# Patient Record
Sex: Female | Born: 1965 | Race: White | Hispanic: No | Marital: Married | Smoking: Former smoker
Health system: Southern US, Community
[De-identification: ages and names within clinical notes are randomized; demographics above are authoritative.]

## PROBLEM LIST (undated history)

## (undated) DIAGNOSIS — R9082 White matter disease, unspecified: Secondary | ICD-10-CM

## (undated) DIAGNOSIS — R011 Cardiac murmur, unspecified: Secondary | ICD-10-CM

## (undated) DIAGNOSIS — R413 Other amnesia: Secondary | ICD-10-CM

## (undated) DIAGNOSIS — I341 Nonrheumatic mitral (valve) prolapse: Secondary | ICD-10-CM

## (undated) DIAGNOSIS — Z8619 Personal history of other infectious and parasitic diseases: Secondary | ICD-10-CM

## (undated) DIAGNOSIS — K5090 Crohn's disease, unspecified, without complications: Secondary | ICD-10-CM

## (undated) DIAGNOSIS — I10 Essential (primary) hypertension: Secondary | ICD-10-CM

## (undated) DIAGNOSIS — K529 Noninfective gastroenteritis and colitis, unspecified: Secondary | ICD-10-CM

## (undated) DIAGNOSIS — K648 Other hemorrhoids: Secondary | ICD-10-CM

## (undated) HISTORY — PX: PARTIAL HYSTERECTOMY: SHX80

## (undated) HISTORY — PX: HEMORRHOID SURGERY: SHX153

## (undated) HISTORY — DX: White matter disease, unspecified: R90.82

## (undated) HISTORY — DX: Crohn's disease, unspecified, without complications: K50.90

## (undated) HISTORY — DX: Nonrheumatic mitral (valve) prolapse: I34.1

## (undated) HISTORY — DX: Other amnesia: R41.3

## (undated) HISTORY — PX: BREAST ENHANCEMENT SURGERY: SHX7

## (undated) HISTORY — DX: Personal history of other infectious and parasitic diseases: Z86.19

## (undated) HISTORY — DX: Cardiac murmur, unspecified: R01.1

## (undated) HISTORY — PX: INCONTINENCE SURGERY: SHX676

## (undated) HISTORY — DX: Essential (primary) hypertension: I10

## (undated) HISTORY — PX: HAND SURGERY: SHX662

## (undated) HISTORY — PX: ESOPHAGOGASTRODUODENOSCOPY: SHX1529

## (undated) HISTORY — PX: OTHER SURGICAL HISTORY: SHX169

## (undated) HISTORY — PX: BREAST REDUCTION SURGERY: SHX8

## (undated) HISTORY — PX: LEEP: SHX91

## (undated) HISTORY — DX: Other hemorrhoids: K64.8

---

## 2015-05-05 DIAGNOSIS — L719 Rosacea, unspecified: Secondary | ICD-10-CM | POA: Diagnosis not present

## 2015-05-05 DIAGNOSIS — I10 Essential (primary) hypertension: Secondary | ICD-10-CM | POA: Diagnosis not present

## 2015-05-05 DIAGNOSIS — K649 Unspecified hemorrhoids: Secondary | ICD-10-CM | POA: Diagnosis not present

## 2015-05-05 DIAGNOSIS — N951 Menopausal and female climacteric states: Secondary | ICD-10-CM | POA: Diagnosis not present

## 2015-05-12 DIAGNOSIS — K6289 Other specified diseases of anus and rectum: Secondary | ICD-10-CM | POA: Diagnosis not present

## 2015-05-12 DIAGNOSIS — K644 Residual hemorrhoidal skin tags: Secondary | ICD-10-CM | POA: Diagnosis not present

## 2015-05-12 DIAGNOSIS — K642 Third degree hemorrhoids: Secondary | ICD-10-CM | POA: Diagnosis not present

## 2015-05-12 DIAGNOSIS — K625 Hemorrhage of anus and rectum: Secondary | ICD-10-CM | POA: Diagnosis not present

## 2015-08-25 DIAGNOSIS — K5090 Crohn's disease, unspecified, without complications: Secondary | ICD-10-CM | POA: Diagnosis not present

## 2015-08-25 DIAGNOSIS — I10 Essential (primary) hypertension: Secondary | ICD-10-CM | POA: Diagnosis not present

## 2015-08-25 DIAGNOSIS — Z79899 Other long term (current) drug therapy: Secondary | ICD-10-CM | POA: Diagnosis not present

## 2015-09-07 DIAGNOSIS — T63481A Toxic effect of venom of other arthropod, accidental (unintentional), initial encounter: Secondary | ICD-10-CM | POA: Diagnosis not present

## 2015-09-07 DIAGNOSIS — M25473 Effusion, unspecified ankle: Secondary | ICD-10-CM | POA: Diagnosis not present

## 2015-09-14 DIAGNOSIS — Z1211 Encounter for screening for malignant neoplasm of colon: Secondary | ICD-10-CM | POA: Diagnosis not present

## 2015-09-14 DIAGNOSIS — K5090 Crohn's disease, unspecified, without complications: Secondary | ICD-10-CM | POA: Diagnosis not present

## 2015-09-14 DIAGNOSIS — Z01818 Encounter for other preprocedural examination: Secondary | ICD-10-CM | POA: Diagnosis not present

## 2015-09-29 DIAGNOSIS — K5289 Other specified noninfective gastroenteritis and colitis: Secondary | ICD-10-CM | POA: Diagnosis not present

## 2015-09-29 DIAGNOSIS — K649 Unspecified hemorrhoids: Secondary | ICD-10-CM | POA: Diagnosis not present

## 2015-09-29 DIAGNOSIS — K5090 Crohn's disease, unspecified, without complications: Secondary | ICD-10-CM | POA: Diagnosis not present

## 2015-09-29 DIAGNOSIS — K6389 Other specified diseases of intestine: Secondary | ICD-10-CM | POA: Diagnosis not present

## 2015-09-29 DIAGNOSIS — K648 Other hemorrhoids: Secondary | ICD-10-CM | POA: Diagnosis not present

## 2015-09-29 DIAGNOSIS — K529 Noninfective gastroenteritis and colitis, unspecified: Secondary | ICD-10-CM | POA: Diagnosis not present

## 2015-09-29 DIAGNOSIS — Z1211 Encounter for screening for malignant neoplasm of colon: Secondary | ICD-10-CM | POA: Diagnosis not present

## 2015-09-29 HISTORY — PX: COLONOSCOPY: SHX174

## 2015-10-13 DIAGNOSIS — K642 Third degree hemorrhoids: Secondary | ICD-10-CM | POA: Diagnosis not present

## 2015-10-13 DIAGNOSIS — K6289 Other specified diseases of anus and rectum: Secondary | ICD-10-CM | POA: Diagnosis not present

## 2015-10-13 DIAGNOSIS — K644 Residual hemorrhoidal skin tags: Secondary | ICD-10-CM | POA: Diagnosis not present

## 2015-10-13 DIAGNOSIS — K625 Hemorrhage of anus and rectum: Secondary | ICD-10-CM | POA: Diagnosis not present

## 2015-10-20 DIAGNOSIS — K642 Third degree hemorrhoids: Secondary | ICD-10-CM | POA: Diagnosis not present

## 2015-10-20 DIAGNOSIS — K644 Residual hemorrhoidal skin tags: Secondary | ICD-10-CM | POA: Diagnosis not present

## 2015-10-20 DIAGNOSIS — K6289 Other specified diseases of anus and rectum: Secondary | ICD-10-CM | POA: Diagnosis not present

## 2015-10-20 DIAGNOSIS — K648 Other hemorrhoids: Secondary | ICD-10-CM | POA: Diagnosis not present

## 2015-10-27 DIAGNOSIS — M720 Palmar fascial fibromatosis [Dupuytren]: Secondary | ICD-10-CM | POA: Diagnosis not present

## 2015-11-17 DIAGNOSIS — M720 Palmar fascial fibromatosis [Dupuytren]: Secondary | ICD-10-CM | POA: Diagnosis not present

## 2015-11-19 DIAGNOSIS — M720 Palmar fascial fibromatosis [Dupuytren]: Secondary | ICD-10-CM | POA: Diagnosis not present

## 2015-12-08 DIAGNOSIS — M720 Palmar fascial fibromatosis [Dupuytren]: Secondary | ICD-10-CM | POA: Diagnosis not present

## 2016-01-18 DIAGNOSIS — Z1231 Encounter for screening mammogram for malignant neoplasm of breast: Secondary | ICD-10-CM | POA: Diagnosis not present

## 2016-02-02 DIAGNOSIS — Z79899 Other long term (current) drug therapy: Secondary | ICD-10-CM | POA: Diagnosis not present

## 2016-02-02 DIAGNOSIS — I10 Essential (primary) hypertension: Secondary | ICD-10-CM | POA: Diagnosis not present

## 2016-03-28 DIAGNOSIS — K5090 Crohn's disease, unspecified, without complications: Secondary | ICD-10-CM | POA: Diagnosis not present

## 2016-04-12 DIAGNOSIS — J301 Allergic rhinitis due to pollen: Secondary | ICD-10-CM | POA: Diagnosis not present

## 2016-04-26 DIAGNOSIS — Z0001 Encounter for general adult medical examination with abnormal findings: Secondary | ICD-10-CM | POA: Diagnosis not present

## 2016-04-26 DIAGNOSIS — Z0000 Encounter for general adult medical examination without abnormal findings: Secondary | ICD-10-CM | POA: Diagnosis not present

## 2016-04-26 DIAGNOSIS — Z79899 Other long term (current) drug therapy: Secondary | ICD-10-CM | POA: Diagnosis not present

## 2016-04-26 DIAGNOSIS — K5090 Crohn's disease, unspecified, without complications: Secondary | ICD-10-CM | POA: Diagnosis not present

## 2016-04-26 DIAGNOSIS — Z124 Encounter for screening for malignant neoplasm of cervix: Secondary | ICD-10-CM | POA: Diagnosis not present

## 2016-04-26 DIAGNOSIS — I10 Essential (primary) hypertension: Secondary | ICD-10-CM | POA: Diagnosis not present

## 2016-07-30 DIAGNOSIS — B029 Zoster without complications: Secondary | ICD-10-CM | POA: Insufficient documentation

## 2016-08-03 DIAGNOSIS — Z6823 Body mass index (BMI) 23.0-23.9, adult: Secondary | ICD-10-CM | POA: Diagnosis not present

## 2016-08-03 DIAGNOSIS — B0230 Zoster ocular disease, unspecified: Secondary | ICD-10-CM | POA: Diagnosis not present

## 2016-08-08 DIAGNOSIS — B029 Zoster without complications: Secondary | ICD-10-CM | POA: Diagnosis not present

## 2016-08-23 DIAGNOSIS — M720 Palmar fascial fibromatosis [Dupuytren]: Secondary | ICD-10-CM | POA: Insufficient documentation

## 2016-08-23 DIAGNOSIS — N393 Stress incontinence (female) (male): Secondary | ICD-10-CM | POA: Diagnosis not present

## 2016-08-23 DIAGNOSIS — I10 Essential (primary) hypertension: Secondary | ICD-10-CM | POA: Insufficient documentation

## 2016-08-23 DIAGNOSIS — K5090 Crohn's disease, unspecified, without complications: Secondary | ICD-10-CM | POA: Insufficient documentation

## 2016-08-23 DIAGNOSIS — N3641 Hypermobility of urethra: Secondary | ICD-10-CM | POA: Diagnosis not present

## 2016-08-23 DIAGNOSIS — K529 Noninfective gastroenteritis and colitis, unspecified: Secondary | ICD-10-CM | POA: Insufficient documentation

## 2016-08-23 DIAGNOSIS — D497 Neoplasm of unspecified behavior of endocrine glands and other parts of nervous system: Secondary | ICD-10-CM | POA: Insufficient documentation

## 2016-09-01 DIAGNOSIS — Z01818 Encounter for other preprocedural examination: Secondary | ICD-10-CM | POA: Diagnosis not present

## 2016-09-01 DIAGNOSIS — N3641 Hypermobility of urethra: Secondary | ICD-10-CM | POA: Diagnosis not present

## 2016-09-01 DIAGNOSIS — I10 Essential (primary) hypertension: Secondary | ICD-10-CM | POA: Diagnosis not present

## 2016-09-01 DIAGNOSIS — N393 Stress incontinence (female) (male): Secondary | ICD-10-CM | POA: Diagnosis not present

## 2016-09-05 DIAGNOSIS — I10 Essential (primary) hypertension: Secondary | ICD-10-CM | POA: Diagnosis not present

## 2016-09-06 DIAGNOSIS — N3641 Hypermobility of urethra: Secondary | ICD-10-CM | POA: Diagnosis not present

## 2016-09-06 DIAGNOSIS — N393 Stress incontinence (female) (male): Secondary | ICD-10-CM | POA: Diagnosis not present

## 2016-09-06 DIAGNOSIS — I10 Essential (primary) hypertension: Secondary | ICD-10-CM | POA: Diagnosis not present

## 2016-09-06 DIAGNOSIS — Z79899 Other long term (current) drug therapy: Secondary | ICD-10-CM | POA: Diagnosis not present

## 2016-09-20 DIAGNOSIS — I10 Essential (primary) hypertension: Secondary | ICD-10-CM | POA: Diagnosis not present

## 2016-09-20 DIAGNOSIS — K1379 Other lesions of oral mucosa: Secondary | ICD-10-CM | POA: Diagnosis not present

## 2016-09-20 DIAGNOSIS — Z79899 Other long term (current) drug therapy: Secondary | ICD-10-CM | POA: Diagnosis not present

## 2016-09-27 DIAGNOSIS — K5090 Crohn's disease, unspecified, without complications: Secondary | ICD-10-CM | POA: Diagnosis not present

## 2016-12-20 DIAGNOSIS — B001 Herpesviral vesicular dermatitis: Secondary | ICD-10-CM | POA: Diagnosis not present

## 2016-12-20 DIAGNOSIS — I10 Essential (primary) hypertension: Secondary | ICD-10-CM | POA: Diagnosis not present

## 2017-01-24 DIAGNOSIS — J0101 Acute recurrent maxillary sinusitis: Secondary | ICD-10-CM | POA: Diagnosis not present

## 2017-01-31 DIAGNOSIS — J019 Acute sinusitis, unspecified: Secondary | ICD-10-CM | POA: Diagnosis not present

## 2017-01-31 DIAGNOSIS — J309 Allergic rhinitis, unspecified: Secondary | ICD-10-CM | POA: Diagnosis not present

## 2017-02-10 DIAGNOSIS — J029 Acute pharyngitis, unspecified: Secondary | ICD-10-CM | POA: Diagnosis not present

## 2017-02-10 DIAGNOSIS — J209 Acute bronchitis, unspecified: Secondary | ICD-10-CM | POA: Diagnosis not present

## 2017-02-21 DIAGNOSIS — H04123 Dry eye syndrome of bilateral lacrimal glands: Secondary | ICD-10-CM | POA: Diagnosis not present

## 2017-02-21 DIAGNOSIS — H5203 Hypermetropia, bilateral: Secondary | ICD-10-CM | POA: Diagnosis not present

## 2017-02-21 DIAGNOSIS — H2513 Age-related nuclear cataract, bilateral: Secondary | ICD-10-CM | POA: Diagnosis not present

## 2017-02-21 DIAGNOSIS — H524 Presbyopia: Secondary | ICD-10-CM | POA: Diagnosis not present

## 2017-03-20 DIAGNOSIS — K5090 Crohn's disease, unspecified, without complications: Secondary | ICD-10-CM | POA: Diagnosis not present

## 2017-03-20 DIAGNOSIS — E538 Deficiency of other specified B group vitamins: Secondary | ICD-10-CM | POA: Diagnosis not present

## 2017-04-04 DIAGNOSIS — R194 Change in bowel habit: Secondary | ICD-10-CM | POA: Diagnosis not present

## 2017-04-04 DIAGNOSIS — E538 Deficiency of other specified B group vitamins: Secondary | ICD-10-CM | POA: Diagnosis not present

## 2017-04-04 DIAGNOSIS — I10 Essential (primary) hypertension: Secondary | ICD-10-CM | POA: Diagnosis not present

## 2017-04-04 DIAGNOSIS — Z79899 Other long term (current) drug therapy: Secondary | ICD-10-CM | POA: Diagnosis not present

## 2017-04-04 DIAGNOSIS — Z6825 Body mass index (BMI) 25.0-25.9, adult: Secondary | ICD-10-CM | POA: Diagnosis not present

## 2017-04-06 DIAGNOSIS — J329 Chronic sinusitis, unspecified: Secondary | ICD-10-CM | POA: Diagnosis not present

## 2017-05-10 DIAGNOSIS — Z01812 Encounter for preprocedural laboratory examination: Secondary | ICD-10-CM | POA: Diagnosis not present

## 2017-05-10 DIAGNOSIS — I10 Essential (primary) hypertension: Secondary | ICD-10-CM | POA: Diagnosis not present

## 2017-05-10 DIAGNOSIS — Z01818 Encounter for other preprocedural examination: Secondary | ICD-10-CM | POA: Diagnosis not present

## 2017-05-23 ENCOUNTER — Telehealth: Payer: Self-pay | Admitting: Gastroenterology

## 2017-05-23 NOTE — Telephone Encounter (Signed)
Patient called Humira and it was a mistake to bill her $500.00.  She says everything is straightened out and no further action required at this time.

## 2017-05-30 DIAGNOSIS — N649 Disorder of breast, unspecified: Secondary | ICD-10-CM | POA: Diagnosis not present

## 2017-07-03 DIAGNOSIS — I10 Essential (primary) hypertension: Secondary | ICD-10-CM | POA: Diagnosis not present

## 2017-07-03 DIAGNOSIS — N61 Mastitis without abscess: Secondary | ICD-10-CM | POA: Diagnosis not present

## 2017-07-04 DIAGNOSIS — N61 Mastitis without abscess: Secondary | ICD-10-CM | POA: Diagnosis not present

## 2017-07-18 DIAGNOSIS — Z6826 Body mass index (BMI) 26.0-26.9, adult: Secondary | ICD-10-CM | POA: Diagnosis not present

## 2017-07-18 DIAGNOSIS — N61 Mastitis without abscess: Secondary | ICD-10-CM | POA: Diagnosis not present

## 2017-08-29 DIAGNOSIS — S21001A Unspecified open wound of right breast, initial encounter: Secondary | ICD-10-CM | POA: Diagnosis not present

## 2017-10-08 ENCOUNTER — Other Ambulatory Visit: Payer: Self-pay | Admitting: Gastroenterology

## 2017-10-10 DIAGNOSIS — Z87891 Personal history of nicotine dependence: Secondary | ICD-10-CM | POA: Diagnosis not present

## 2017-10-10 DIAGNOSIS — Z8542 Personal history of malignant neoplasm of other parts of uterus: Secondary | ICD-10-CM | POA: Diagnosis not present

## 2017-10-10 DIAGNOSIS — L98492 Non-pressure chronic ulcer of skin of other sites with fat layer exposed: Secondary | ICD-10-CM | POA: Diagnosis not present

## 2017-10-10 DIAGNOSIS — D649 Anemia, unspecified: Secondary | ICD-10-CM | POA: Diagnosis not present

## 2017-10-10 DIAGNOSIS — S21001A Unspecified open wound of right breast, initial encounter: Secondary | ICD-10-CM | POA: Diagnosis not present

## 2017-10-10 DIAGNOSIS — T8189XA Other complications of procedures, not elsewhere classified, initial encounter: Secondary | ICD-10-CM | POA: Diagnosis not present

## 2017-10-10 DIAGNOSIS — X58XXXA Exposure to other specified factors, initial encounter: Secondary | ICD-10-CM | POA: Diagnosis not present

## 2017-10-10 MED ORDER — ADALIMUMAB 40 MG/0.8ML ~~LOC~~ PSKT: 40.0000 mg | PREFILLED_SYRINGE | SUBCUTANEOUS | 3 refills | Status: DC

## 2017-10-10 NOTE — Telephone Encounter (Signed)
Refill for Humira sent to pharmacy.

## 2017-10-10 NOTE — Telephone Encounter (Signed)
Brooke Holmes from [facility] is calling in regarding this rx. Best # [phone].

## 2017-10-17 DIAGNOSIS — X58XXXA Exposure to other specified factors, initial encounter: Secondary | ICD-10-CM | POA: Diagnosis not present

## 2017-10-17 DIAGNOSIS — S21001A Unspecified open wound of right breast, initial encounter: Secondary | ICD-10-CM | POA: Diagnosis not present

## 2017-10-17 DIAGNOSIS — L98492 Non-pressure chronic ulcer of skin of other sites with fat layer exposed: Secondary | ICD-10-CM | POA: Diagnosis not present

## 2017-10-17 DIAGNOSIS — T8189XA Other complications of procedures, not elsewhere classified, initial encounter: Secondary | ICD-10-CM | POA: Diagnosis not present

## 2017-10-24 DIAGNOSIS — X58XXXA Exposure to other specified factors, initial encounter: Secondary | ICD-10-CM | POA: Diagnosis not present

## 2017-10-24 DIAGNOSIS — L98492 Non-pressure chronic ulcer of skin of other sites with fat layer exposed: Secondary | ICD-10-CM | POA: Diagnosis not present

## 2017-10-24 DIAGNOSIS — S21001A Unspecified open wound of right breast, initial encounter: Secondary | ICD-10-CM | POA: Diagnosis not present

## 2017-10-24 DIAGNOSIS — T8189XA Other complications of procedures, not elsewhere classified, initial encounter: Secondary | ICD-10-CM | POA: Diagnosis not present

## 2017-10-29 DIAGNOSIS — L98492 Non-pressure chronic ulcer of skin of other sites with fat layer exposed: Secondary | ICD-10-CM | POA: Diagnosis not present

## 2017-10-29 DIAGNOSIS — S21001A Unspecified open wound of right breast, initial encounter: Secondary | ICD-10-CM | POA: Diagnosis not present

## 2017-10-29 DIAGNOSIS — X58XXXA Exposure to other specified factors, initial encounter: Secondary | ICD-10-CM | POA: Diagnosis not present

## 2017-10-29 DIAGNOSIS — T8189XA Other complications of procedures, not elsewhere classified, initial encounter: Secondary | ICD-10-CM | POA: Diagnosis not present

## 2017-10-31 DIAGNOSIS — N3641 Hypermobility of urethra: Secondary | ICD-10-CM | POA: Diagnosis not present

## 2017-10-31 DIAGNOSIS — N393 Stress incontinence (female) (male): Secondary | ICD-10-CM | POA: Diagnosis not present

## 2017-11-06 DIAGNOSIS — Z09 Encounter for follow-up examination after completed treatment for conditions other than malignant neoplasm: Secondary | ICD-10-CM | POA: Diagnosis not present

## 2017-11-06 DIAGNOSIS — L98499 Non-pressure chronic ulcer of skin of other sites with unspecified severity: Secondary | ICD-10-CM | POA: Diagnosis not present

## 2017-11-06 DIAGNOSIS — T8189XA Other complications of procedures, not elsewhere classified, initial encounter: Secondary | ICD-10-CM | POA: Diagnosis not present

## 2017-11-06 DIAGNOSIS — Z87828 Personal history of other (healed) physical injury and trauma: Secondary | ICD-10-CM | POA: Diagnosis not present

## 2017-11-13 DIAGNOSIS — E663 Overweight: Secondary | ICD-10-CM | POA: Diagnosis not present

## 2017-11-13 DIAGNOSIS — Z6826 Body mass index (BMI) 26.0-26.9, adult: Secondary | ICD-10-CM | POA: Diagnosis not present

## 2017-11-13 DIAGNOSIS — Z79899 Other long term (current) drug therapy: Secondary | ICD-10-CM | POA: Diagnosis not present

## 2017-11-13 DIAGNOSIS — I10 Essential (primary) hypertension: Secondary | ICD-10-CM | POA: Diagnosis not present

## 2017-11-13 DIAGNOSIS — K5090 Crohn's disease, unspecified, without complications: Secondary | ICD-10-CM | POA: Diagnosis not present

## 2017-11-28 DIAGNOSIS — M79641 Pain in right hand: Secondary | ICD-10-CM | POA: Diagnosis not present

## 2017-11-28 DIAGNOSIS — M720 Palmar fascial fibromatosis [Dupuytren]: Secondary | ICD-10-CM | POA: Diagnosis not present

## 2017-11-28 DIAGNOSIS — M79642 Pain in left hand: Secondary | ICD-10-CM | POA: Diagnosis not present

## 2018-01-07 DIAGNOSIS — M720 Palmar fascial fibromatosis [Dupuytren]: Secondary | ICD-10-CM | POA: Diagnosis not present

## 2018-01-09 DIAGNOSIS — M79642 Pain in left hand: Secondary | ICD-10-CM | POA: Diagnosis not present

## 2018-01-09 DIAGNOSIS — M720 Palmar fascial fibromatosis [Dupuytren]: Secondary | ICD-10-CM | POA: Diagnosis not present

## 2018-01-18 ENCOUNTER — Other Ambulatory Visit: Payer: Self-pay

## 2018-01-18 DIAGNOSIS — K50919 Crohn's disease, unspecified, with unspecified complications: Secondary | ICD-10-CM

## 2018-01-18 MED ORDER — ADALIMUMAB 40 MG/0.8ML ~~LOC~~ PSKT: 40.0000 mg | PREFILLED_SYRINGE | SUBCUTANEOUS | 6 refills | Status: DC

## 2018-01-24 DIAGNOSIS — M79642 Pain in left hand: Secondary | ICD-10-CM | POA: Diagnosis not present

## 2018-01-24 DIAGNOSIS — M720 Palmar fascial fibromatosis [Dupuytren]: Secondary | ICD-10-CM | POA: Diagnosis not present

## 2018-01-24 DIAGNOSIS — Z5189 Encounter for other specified aftercare: Secondary | ICD-10-CM | POA: Diagnosis not present

## 2018-01-28 DIAGNOSIS — N61 Mastitis without abscess: Secondary | ICD-10-CM | POA: Diagnosis not present

## 2018-01-28 DIAGNOSIS — Z6827 Body mass index (BMI) 27.0-27.9, adult: Secondary | ICD-10-CM | POA: Diagnosis not present

## 2018-02-04 ENCOUNTER — Telehealth: Payer: Self-pay | Admitting: Gastroenterology

## 2018-02-04 ENCOUNTER — Encounter: Payer: Self-pay | Admitting: Gastroenterology

## 2018-02-04 DIAGNOSIS — K50919 Crohn's disease, unspecified, with unspecified complications: Secondary | ICD-10-CM

## 2018-02-04 NOTE — Telephone Encounter (Signed)
Pt needs rf for humira.

## 2018-02-05 MED ORDER — ADALIMUMAB 40 MG/0.8ML ~~LOC~~ PSKT: 40.0000 mg | PREFILLED_SYRINGE | SUBCUTANEOUS | 6 refills | Status: DC

## 2018-02-05 NOTE — Telephone Encounter (Signed)
Sent refill to patients pharmacy.

## 2018-03-14 ENCOUNTER — Encounter: Payer: Self-pay | Admitting: Gastroenterology

## 2018-03-18 ENCOUNTER — Ambulatory Visit: Payer: Self-pay | Admitting: Gastroenterology

## 2018-03-18 ENCOUNTER — Ambulatory Visit: Payer: BLUE CROSS/BLUE SHIELD | Admitting: Gastroenterology

## 2018-03-18 VITALS — BP 138/62 | HR 74 | Ht 65.0 in | Wt 162.0 lb

## 2018-03-18 DIAGNOSIS — K50919 Crohn's disease, unspecified, with unspecified complications: Secondary | ICD-10-CM

## 2018-03-18 MED ORDER — ADALIMUMAB 40 MG/0.8ML ~~LOC~~ AJKT: 1.0000 "pen " | AUTO-INJECTOR | SUBCUTANEOUS | 4 refills | Status: DC

## 2018-03-18 MED ORDER — FLUTICASONE PROPIONATE 0.05 % EX CREA: TOPICAL_CREAM | Freq: Two times a day (BID) | CUTANEOUS | 1 refills | Status: AC

## 2018-03-18 NOTE — Progress Notes (Signed)
Chief Complaint:   Referring Provider:  Ernestene Kiel, MD      ASSESSMENT AND PLAN;   #1. Crohn's colitis. Dx 2004, involving transverse/desc colon with Nl TI. Rx with steroids, Asacol and then Lialda. Nl TPMT, neg TB test.  Now on maintenance Humira since 03/2014.  Status post Pneumovax 01/2014 #2. B12 deficiency. #3. Internal hemorrhoids.  No perianal Crohn's.  Plan: -Humira pen 29m SQ Q2weekly -B12 128mIM Q monthly. -For Hoids- fluticasone cream 0.05% generic 30g 1 bid PR PRN x 10 days, 1 refill -Has appt Dr PrLaqueta Dueor blood tests later this week. -FU in 1 year.  Earlier in case of any problems.  Recommend repeating colonoscopy next year.   HPI:    Brooke Holmes a [age].o. female  Doing very well. No nausea, vomiting, heartburn, regurgitation, odynophagia or dysphagia.  No significant diarrhea or constipation.  There is no melena or hematochezia. No unintentional weight loss. Here for routine follow-up visit. Wanted to have prescription for external hemorrhoids in case she needs any in the future.  Has not been bothering her. Would like to get colonoscopy done next year rather than this year.    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 [place] 26[place] Past Medical History:  Diagnosis Date  . Crohn's disease ([place]  . Hx of Clostridium difficile infection   . Internal hemorrhoid   . Mitral valve prolapse     No family history on file.  Social History   Tobacco Use  . Smoking status: Not on file  Substance Use Topics  . Alcohol use: Not on file  . Drug use: Not on file    Current Outpatient Medications  Medication Sig Dispense Refill  . acyclovir (ZOVIRAX) 800 MG tablet Take 800 mg by mouth 5 (five) times daily.    . Adalimumab (HUMIRA) 40 MG/0.8ML PSKT Inject 0.8 mLs (40 mg total) into the skin every 14  (fourteen) days. Pt needs to make f/u appt for 4-6 months from now 2 each 6  . Cyanocobalamin 1000 MCG/ML KIT Inject as directed.    . Marland KitchenPINEPHrine (EPIPEN 2-PAK IJ) Inject as directed.    . metroNIDAZOLE (METROCREAM) 0.75 % cream Apply topically 2 (two) times daily.    . phentermine 37.5 MG capsule Take 37.5 mg by mouth every morning.    . topiramate (TOPAMAX) 50 MG tablet Take 50 mg by mouth 2 (two) times daily.    . Marland Kitchenriamcinolone cream (KENALOG) 0.1 % Apply 1 application topically 2 (two) times daily.    . valsartan (DIOVAN) 160 MG tablet Take 160 mg by mouth daily.     No current facility-administered medications for this visit.     Allergies  Allergen Reactions  . Ciprofloxacin   . Metronidazole   . Morphine And Related     Review of Systems:  Constitutional: Denies fever, chills, diaphoresis, appetite change and fatigue.  HEENT: Denies photophobia, eye pain, redness, hearing loss, ear pain, congestion, sore throat, rhinorrhea, sneezing, mouth sores, neck pain, neck stiffness and tinnitus.   Respiratory: Denies SOB, DOE, cough, chest tightness,  and wheezing.   Cardiovascular: Denies chest pain, palpitations and leg swelling.  Genitourinary: Denies dysuria, urgency, frequency, hematuria, flank pain and difficulty urinating.  Musculoskeletal: Denies myalgias, back pain, joint swelling, arthralgias and gait problem.  Skin: No rash.  Neurological: Denies dizziness, seizures, syncope, weakness,  light-headedness, numbness and headaches.  Hematological: Denies adenopathy. Easy bruising, personal or family bleeding history  Psychiatric/Behavioral: No anxiety or depression     Physical Exam:    BP 138/62   Pulse 74   Ht 5' 5"  (1.651 m)   Wt 162 lb (73.5 kg)   BMI 26.96 kg/m  Filed Weights   03/18/18 1326  Weight: 162 lb (73.5 kg)   Constitutional:  Well-developed, in no acute distress. Psychiatric: Normal mood and affect. Behavior is normal. HEENT: Pupils normal.   Conjunctivae are normal. No scleral icterus. Cardiovascular: Normal rate, regular rhythm. No edema Pulmonary/chest: Effort normal and breath sounds normal. No wheezing, rales or rhonchi. Abdominal: Soft, nondistended. Nontender. Bowel sounds active throughout. There are no masses palpable. No hepatomegaly. Rectal:  defered Neurological: Alert and oriented to person place and time. Skin: Skin is warm and dry. No rashes noted.  Data Reviewed: I have personally reviewed following labs and imaging studies    Carmell Austria, MD 03/18/2018, 1:58 PM  Cc: Ernestene Kiel, MD

## 2018-03-18 NOTE — Patient Instructions (Addendum)
If you are age 52 or older, your body mass index should be between 23-30. Your Body mass index is 26.96 kg/m. If this is out of the aforementioned range listed, please consider follow up with your Primary Care Provider.  If you are age 43 or younger, your body mass index should be between 19-25. Your Body mass index is 26.96 kg/m. If this is out of the aformentioned range listed, please consider follow up with your Primary Care Provider.   We have sent the following medications to your pharmacy for you to pick up at your convenience: Fluticasone Humira  Thank you,  Dr. Jackquline Denmark

## 2018-03-19 DIAGNOSIS — Z6827 Body mass index (BMI) 27.0-27.9, adult: Secondary | ICD-10-CM | POA: Diagnosis not present

## 2018-03-19 DIAGNOSIS — I10 Essential (primary) hypertension: Secondary | ICD-10-CM | POA: Diagnosis not present

## 2018-03-19 DIAGNOSIS — N644 Mastodynia: Secondary | ICD-10-CM | POA: Diagnosis not present

## 2018-03-19 DIAGNOSIS — K5090 Crohn's disease, unspecified, without complications: Secondary | ICD-10-CM | POA: Diagnosis not present

## 2018-03-20 ENCOUNTER — Other Ambulatory Visit: Payer: Self-pay

## 2018-03-20 MED ORDER — ADALIMUMAB 40 MG/0.8ML ~~LOC~~ AJKT: 1.0000 "pen " | AUTO-INJECTOR | SUBCUTANEOUS | 4 refills | Status: DC

## 2018-03-20 NOTE — Telephone Encounter (Signed)
Resent prescription to [facility], left message for patient.

## 2018-03-20 NOTE — Telephone Encounter (Signed)
Pt called wants to know if we sent her Humira to the correct place. It was sent to [facility] but she states it normally comes from [facility] - [place], [place] [id number]. WJust want to know if its okay.

## 2018-04-08 DIAGNOSIS — M720 Palmar fascial fibromatosis [Dupuytren]: Secondary | ICD-10-CM | POA: Diagnosis not present

## 2018-04-08 DIAGNOSIS — M79642 Pain in left hand: Secondary | ICD-10-CM | POA: Diagnosis not present

## 2018-04-10 DIAGNOSIS — M720 Palmar fascial fibromatosis [Dupuytren]: Secondary | ICD-10-CM | POA: Diagnosis not present

## 2018-04-10 DIAGNOSIS — M79642 Pain in left hand: Secondary | ICD-10-CM | POA: Diagnosis not present

## 2018-04-10 DIAGNOSIS — Z5189 Encounter for other specified aftercare: Secondary | ICD-10-CM | POA: Diagnosis not present

## 2018-04-25 ENCOUNTER — Telehealth: Payer: Self-pay | Admitting: Gastroenterology

## 2018-04-25 NOTE — Telephone Encounter (Signed)
Left message for patient to call back with the type of cosmetic procedure she is having done.

## 2018-04-25 NOTE — Telephone Encounter (Signed)
Pt requested clearance for Humira faxed to Dr. [employer]: [phone]

## 2018-04-30 NOTE — Telephone Encounter (Signed)
Dr. Brennan Bailey office called inquiring about clearance that pt needs for cosmetic surgery. Pt is having a breast lift and upper eyelid surgery. Pls fax clearance to [phone].

## 2018-05-01 NOTE — Telephone Encounter (Signed)
Pt called back on the previous msg.

## 2018-05-01 NOTE — Telephone Encounter (Signed)
Please advise 

## 2018-05-01 NOTE — Telephone Encounter (Signed)
Dr. Brennan Bailey office called inquiring about clearance that pt needs for cosmetic surgery. Pt is having a breast lift and upper eyelid surgery. Pls fax clearance to [phone].

## 2018-05-03 ENCOUNTER — Other Ambulatory Visit: Payer: Self-pay

## 2018-05-03 NOTE — Telephone Encounter (Signed)
Faxed note to the doctor office. See letters from today's date in chart.

## 2018-05-03 NOTE — Telephone Encounter (Signed)
Cleared for surgery Can do surgery 2 weeks after Humira.  Restart 2 weeks after surgery. In other words she just has to miss 1 dose of Humira.

## 2018-07-10 DIAGNOSIS — M720 Palmar fascial fibromatosis [Dupuytren]: Secondary | ICD-10-CM | POA: Diagnosis not present

## 2018-07-23 DIAGNOSIS — E663 Overweight: Secondary | ICD-10-CM | POA: Diagnosis not present

## 2018-07-23 DIAGNOSIS — I10 Essential (primary) hypertension: Secondary | ICD-10-CM | POA: Diagnosis not present

## 2018-07-23 DIAGNOSIS — K5090 Crohn's disease, unspecified, without complications: Secondary | ICD-10-CM | POA: Diagnosis not present

## 2018-07-23 DIAGNOSIS — Z6828 Body mass index (BMI) 28.0-28.9, adult: Secondary | ICD-10-CM | POA: Diagnosis not present

## 2018-08-09 DIAGNOSIS — M79642 Pain in left hand: Secondary | ICD-10-CM | POA: Diagnosis not present

## 2018-08-09 DIAGNOSIS — M720 Palmar fascial fibromatosis [Dupuytren]: Secondary | ICD-10-CM | POA: Diagnosis not present

## 2018-11-05 DIAGNOSIS — I10 Essential (primary) hypertension: Secondary | ICD-10-CM | POA: Diagnosis not present

## 2018-11-05 DIAGNOSIS — B001 Herpesviral vesicular dermatitis: Secondary | ICD-10-CM | POA: Diagnosis not present

## 2018-11-05 DIAGNOSIS — K5090 Crohn's disease, unspecified, without complications: Secondary | ICD-10-CM | POA: Diagnosis not present

## 2018-11-05 DIAGNOSIS — F419 Anxiety disorder, unspecified: Secondary | ICD-10-CM | POA: Diagnosis not present

## 2018-11-05 DIAGNOSIS — Z79899 Other long term (current) drug therapy: Secondary | ICD-10-CM | POA: Diagnosis not present

## 2018-12-05 DIAGNOSIS — M545 Low back pain: Secondary | ICD-10-CM | POA: Diagnosis not present

## 2018-12-05 DIAGNOSIS — M546 Pain in thoracic spine: Secondary | ICD-10-CM | POA: Diagnosis not present

## 2018-12-05 DIAGNOSIS — M542 Cervicalgia: Secondary | ICD-10-CM | POA: Diagnosis not present

## 2018-12-05 DIAGNOSIS — R413 Other amnesia: Secondary | ICD-10-CM | POA: Diagnosis not present

## 2018-12-13 ENCOUNTER — Telehealth: Payer: Self-pay | Admitting: Gastroenterology

## 2018-12-13 DIAGNOSIS — K50919 Crohn's disease, unspecified, with unspecified complications: Secondary | ICD-10-CM

## 2018-12-13 NOTE — Telephone Encounter (Signed)
Is the Humira refill appropriate for this patient? Does she need to be scheduled for a f/u OV?

## 2018-12-13 NOTE — Telephone Encounter (Signed)
Humira refill is appropriate Same dose Same frequency 6 refills  Thx  RG

## 2018-12-13 NOTE — Telephone Encounter (Signed)
[facility] called to request a refill for Humira.

## 2018-12-16 MED ORDER — HUMIRA 40 MG/0.8ML ~~LOC~~ PSKT: 40.0000 mg | PREFILLED_SYRINGE | SUBCUTANEOUS | 6 refills | Status: DC

## 2018-12-16 NOTE — Telephone Encounter (Signed)
Attempted to reach patient- patient needs to be scheduled for a f/u OV per last notation in chart-patient has been scheduled for an OV on 12/30/2018 at 10:40am;  Humira refill will be sent to the pharmacy listed in chart per MD recommendations;

## 2018-12-17 DIAGNOSIS — R519 Headache, unspecified: Secondary | ICD-10-CM | POA: Diagnosis not present

## 2018-12-17 DIAGNOSIS — M542 Cervicalgia: Secondary | ICD-10-CM | POA: Diagnosis not present

## 2018-12-17 DIAGNOSIS — R413 Other amnesia: Secondary | ICD-10-CM | POA: Diagnosis not present

## 2018-12-17 NOTE — Telephone Encounter (Signed)
Left message for patient to call back to the office;  

## 2018-12-18 NOTE — Telephone Encounter (Signed)
Left message for patient to call back to the office;  

## 2018-12-18 NOTE — Telephone Encounter (Signed)
Patient returned call to the office-patient advised of appt date/time that had been placed-patient is appreciative and is agreeable to appt on 12/30/2018 at 10:40 am; Patient advised to call back to the office at [phone] should questions/concerns arise;  Patient verbalized understanding of information/instructions;

## 2018-12-30 ENCOUNTER — Other Ambulatory Visit: Payer: Self-pay

## 2018-12-30 ENCOUNTER — Encounter: Payer: Self-pay | Admitting: Gastroenterology

## 2018-12-30 ENCOUNTER — Telehealth (INDEPENDENT_AMBULATORY_CARE_PROVIDER_SITE_OTHER): Payer: BC Managed Care – PPO | Admitting: Gastroenterology

## 2018-12-30 VITALS — Ht 65.0 in | Wt 150.0 lb

## 2018-12-30 DIAGNOSIS — K50919 Crohn's disease, unspecified, with unspecified complications: Secondary | ICD-10-CM | POA: Diagnosis not present

## 2018-12-30 NOTE — Patient Instructions (Addendum)
If you are age 52 or older, your body mass index should be between 23-30. Your Body mass index is 24.96 kg/m. If this is out of the aforementioned range listed, please consider follow up with your Primary Care Provider.  If you are age 14 or younger, your body mass index should be between 19-25. Your Body mass index is 24.96 kg/m. If this is out of the aformentioned range listed, please consider follow up with your Primary Care Provider.   Continue the following medications: Vitamin B12 70m IM once monthly. Humira 40 mg sub-q every 2 weeks.   Please make an appointment with your Neurologist asap.   Follow up in 6 months.   Thank you,  Dr. RJackquline Denmark
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
## 2018-12-31 DIAGNOSIS — S8991XA Unspecified injury of right lower leg, initial encounter: Secondary | ICD-10-CM | POA: Diagnosis not present

## 2018-12-31 DIAGNOSIS — R300 Dysuria: Secondary | ICD-10-CM | POA: Diagnosis not present

## 2018-12-31 DIAGNOSIS — N201 Calculus of ureter: Secondary | ICD-10-CM | POA: Diagnosis not present

## 2018-12-31 DIAGNOSIS — M79661 Pain in right lower leg: Secondary | ICD-10-CM | POA: Diagnosis not present

## 2018-12-31 DIAGNOSIS — M7989 Other specified soft tissue disorders: Secondary | ICD-10-CM | POA: Diagnosis not present

## 2018-12-31 DIAGNOSIS — R109 Unspecified abdominal pain: Secondary | ICD-10-CM | POA: Diagnosis not present

## 2019-01-10 ENCOUNTER — Telehealth: Payer: Self-pay

## 2019-01-10 ENCOUNTER — Encounter: Payer: Self-pay | Admitting: Neurology

## 2019-01-10 ENCOUNTER — Ambulatory Visit: Payer: BC Managed Care – PPO | Admitting: Neurology

## 2019-01-10 ENCOUNTER — Other Ambulatory Visit: Payer: Self-pay

## 2019-01-10 VITALS — BP 146/106 | HR 72 | Temp 97.7°F | Ht 65.0 in | Wt 161.8 lb

## 2019-01-10 DIAGNOSIS — R9089 Other abnormal findings on diagnostic imaging of central nervous system: Secondary | ICD-10-CM | POA: Diagnosis not present

## 2019-01-10 NOTE — Telephone Encounter (Signed)
Hilda Blades could you assist with this? Thanks!
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
## 2019-01-10 NOTE — Telephone Encounter (Signed)
-----   Message from Kathrynn Ducking, MD sent at 01/10/2019  9:54 AM EST ----- Please contact [facility] and get the disc of the MRI of the brain done recently on this patient sent to our office.  Thank you.

## 2019-01-13 NOTE — Telephone Encounter (Signed)
done

## 2019-01-15 ENCOUNTER — Telehealth: Payer: Self-pay

## 2019-01-15 LAB — B. BURGDORFI ANTIBODIES: Lyme IgG/IgM Ab: <0.91 {ISR} (ref 0.00–0.90)

## 2019-01-15 LAB — PAN-ANCA
ANCA Proteinase 3: <3.5 U/mL (ref 0.0–3.5)
Atypical pANCA: <1:20 {titer}
C-ANCA: <1:20 {titer}
Myeloperoxidase Ab: <9 U/mL (ref 0.0–9.0)
P-ANCA: <1:20 {titer}

## 2019-01-15 LAB — ANA W/REFLEX: Anti Nuclear Antibody (ANA): NEGATIVE

## 2019-01-15 LAB — ANGIOTENSIN CONVERTING ENZYME: Angio Convert Enzyme: 53 U/L (ref 14–82)

## 2019-01-15 NOTE — Telephone Encounter (Signed)
I contacted the pt and left a vm advising of results. Requested a call back if she had any questions.

## 2019-01-15 NOTE — Telephone Encounter (Signed)
-----   Message from Kathrynn Ducking, MD sent at 01/15/2019 11:13 AM EST -----  The blood work results are unremarkable. Please call the patient. ----- Message ----- From: Lavone Neri Lab Results In Sent: 01/11/2019   7:37 AM EST To: Kathrynn Ducking, MD

## 2019-01-20 ENCOUNTER — Telehealth: Payer: Self-pay

## 2019-01-20 NOTE — Telephone Encounter (Signed)
-----   Message from Kathrynn Ducking, MD sent at 01/20/2019  7:15 AM EST ----- I got the MRI written report of the MRI of the brain done at [facility] on 18 December 2018.  I need to disc sent to our office for review.  Thank you.

## 2019-01-20 NOTE — Telephone Encounter (Signed)
Hilda Blades could you assist with this? Thanks!

## 2019-01-21 NOTE — Telephone Encounter (Addendum)
Request made. [facility] [phone]

## 2019-02-05 ENCOUNTER — Telehealth: Payer: Self-pay

## 2019-02-05 NOTE — Telephone Encounter (Signed)
Patient  husband called stating wife would like a CB in regards to her MRI results. Please follow up

## 2019-02-06 NOTE — Telephone Encounter (Signed)
Brooke Holmes called stating that provider has not called them back to discuss MRI. Brooke Holmes states he was in the room with the pt last visit and provider stated that the MRI would be reviewed and they would be called. Please advise.

## 2019-02-06 NOTE — Telephone Encounter (Addendum)
Called patient and informed her that per Dr Jannifer Franklin' note he was probably going to request a disk of MRI brain images to review, done at [facility] last year. I advised he is out of the office this week, so I am unable to ask if he received the disk to review. I reviewed his note with her re: MRI report results. She stated she was concerned about white matter disease. I reassured her that per Dr Jannifer Franklin' note it is white matter changes most likely due to her hypertension.  She asked to be called when Dr Jannifer Franklin has reviewed disk.  I advised her I will let him know when he returns. Patient verbalized understanding, appreciation.

## 2019-02-06 NOTE — Telephone Encounter (Signed)
Left vm for Brooke Holmes on dpr. I left vm that MRI was discuss with pt at last visit on 01/10/2019.Advise him to call back if he has more questions. Rn does not know if Brooke Holmes was at the visit with pt.

## 2019-02-06 NOTE — Telephone Encounter (Signed)
Report given to Dr. Rexene Alberts of MRI while Dr. Jannifer Franklin is out.

## 2019-02-06 NOTE — Telephone Encounter (Signed)
I received an MRI report from 12/17/2018.  It appears that Dr. Jannifer Franklin has already reported this in his note from 01/10/2019.  This is when the patient was seen in clinic and MRI findings were discussed during the office appointments.  Nothing further needed at this time, he did consider repeating the MRI but this is the MRI report that he quoted in his note.

## 2019-02-10 NOTE — Telephone Encounter (Signed)
I called the patient, we have requested the disc of the MRI of the brain done at [facility], I have not yet seen the disc to review it.  The written report indicated greater than expected for age white matter changes.  The patient does have a history of significant hypertension however.  We will try to rerequest the disc.

## 2019-02-10 NOTE — Telephone Encounter (Signed)
Debra, When you can, can you f/u on this? The original request was made on 01/20/2019. Thanks!

## 2019-02-11 ENCOUNTER — Telehealth: Payer: Self-pay | Admitting: Neurology

## 2019-02-11 NOTE — Telephone Encounter (Signed)
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  The patient does have a history of significant hypertension at a very early age and the changes seen here are likely related to this.  Adequate control of blood pressure, low-dose aspirin may be reasonable.  We can certainly follow the MRI over time, recheck in 1 to 2 years.  Brain injury due to concussion without contusion is usually not apparent on MRI, the changes are related to micro shear forces.

## 2019-02-19 ENCOUNTER — Ambulatory Visit: Payer: BC Managed Care – PPO | Admitting: Diagnostic Neuroimaging

## 2019-02-26 ENCOUNTER — Telehealth: Payer: Self-pay | Admitting: Gastroenterology

## 2019-02-26 DIAGNOSIS — K50919 Crohn's disease, unspecified, with unspecified complications: Secondary | ICD-10-CM

## 2019-02-26 NOTE — Telephone Encounter (Signed)
Left message for patient to call back-pharmacy not listed in chart and unable to locate using number given

## 2019-02-28 MED ORDER — HUMIRA 40 MG/0.8ML ~~LOC~~ PSKT: 40.0000 mg | PREFILLED_SYRINGE | SUBCUTANEOUS | 6 refills | Status: DC

## 2019-02-28 NOTE — Telephone Encounter (Signed)
Patient returned call to the office- patient is requesting the Humira RX be sent to alternative pharmacy=RX sent to alternative pharmacy as requested by patient -advised patient to call back to the office if further questions/concerns arise; Patient verbalized understanding of information/instructions;

## 2019-03-04 NOTE — Telephone Encounter (Signed)
Called and spoke with patient-patient given website for  address for [email or web address].com for possible copay assistance ($5.00/month for medication) ; patient appreciative of information;  Patient advised to call back to the office at [phone] should questions/concerns arise;  Patient verbalized understanding of information/instructions;

## 2019-03-04 NOTE — Telephone Encounter (Signed)
Pt says copay is $190 a month for Humira.  States she can't afford that ans would like to know if there is anything you can do to get the price lower for her.

## 2019-04-06 DIAGNOSIS — H1012 Acute atopic conjunctivitis, left eye: Secondary | ICD-10-CM | POA: Diagnosis not present

## 2019-05-06 DIAGNOSIS — I10 Essential (primary) hypertension: Secondary | ICD-10-CM | POA: Diagnosis not present

## 2019-05-06 DIAGNOSIS — F419 Anxiety disorder, unspecified: Secondary | ICD-10-CM | POA: Diagnosis not present

## 2019-05-06 DIAGNOSIS — B001 Herpesviral vesicular dermatitis: Secondary | ICD-10-CM | POA: Diagnosis not present

## 2019-05-06 DIAGNOSIS — K5090 Crohn's disease, unspecified, without complications: Secondary | ICD-10-CM | POA: Diagnosis not present

## 2019-06-04 DIAGNOSIS — B359 Dermatophytosis, unspecified: Secondary | ICD-10-CM | POA: Diagnosis not present

## 2019-06-04 DIAGNOSIS — L299 Pruritus, unspecified: Secondary | ICD-10-CM | POA: Diagnosis not present

## 2019-07-01 DIAGNOSIS — H15002 Unspecified scleritis, left eye: Secondary | ICD-10-CM | POA: Diagnosis not present

## 2019-07-08 ENCOUNTER — Telehealth: Payer: Self-pay | Admitting: Gastroenterology

## 2019-07-08 ENCOUNTER — Encounter: Payer: Self-pay | Admitting: Gastroenterology

## 2019-07-08 DIAGNOSIS — H35033 Hypertensive retinopathy, bilateral: Secondary | ICD-10-CM | POA: Diagnosis not present

## 2019-07-08 DIAGNOSIS — K5080 Crohn's disease of both small and large intestine without complications: Secondary | ICD-10-CM | POA: Insufficient documentation

## 2019-07-08 DIAGNOSIS — H3581 Retinal edema: Secondary | ICD-10-CM | POA: Insufficient documentation

## 2019-07-08 DIAGNOSIS — H15003 Unspecified scleritis, bilateral: Secondary | ICD-10-CM | POA: Diagnosis not present

## 2019-07-09 DIAGNOSIS — Z111 Encounter for screening for respiratory tuberculosis: Secondary | ICD-10-CM | POA: Diagnosis not present

## 2019-07-09 DIAGNOSIS — Z118 Encounter for screening for other infectious and parasitic diseases: Secondary | ICD-10-CM | POA: Diagnosis not present

## 2019-07-09 DIAGNOSIS — Z113 Encounter for screening for infections with a predominantly sexual mode of transmission: Secondary | ICD-10-CM | POA: Diagnosis not present

## 2019-07-09 DIAGNOSIS — H15003 Unspecified scleritis, bilateral: Secondary | ICD-10-CM | POA: Diagnosis not present

## 2019-07-14 DIAGNOSIS — H15003 Unspecified scleritis, bilateral: Secondary | ICD-10-CM | POA: Diagnosis not present

## 2019-07-16 ENCOUNTER — Ambulatory Visit: Payer: BC Managed Care – PPO | Admitting: Neurology

## 2019-08-05 DIAGNOSIS — H15003 Unspecified scleritis, bilateral: Secondary | ICD-10-CM | POA: Diagnosis not present

## 2019-08-05 DIAGNOSIS — K5080 Crohn's disease of both small and large intestine without complications: Secondary | ICD-10-CM | POA: Diagnosis not present

## 2019-08-05 DIAGNOSIS — H35033 Hypertensive retinopathy, bilateral: Secondary | ICD-10-CM | POA: Diagnosis not present

## 2019-08-08 DIAGNOSIS — I10 Essential (primary) hypertension: Secondary | ICD-10-CM | POA: Diagnosis not present

## 2019-08-08 DIAGNOSIS — G4700 Insomnia, unspecified: Secondary | ICD-10-CM | POA: Diagnosis not present

## 2019-08-08 DIAGNOSIS — N898 Other specified noninflammatory disorders of vagina: Secondary | ICD-10-CM | POA: Diagnosis not present

## 2019-08-08 DIAGNOSIS — H15003 Unspecified scleritis, bilateral: Secondary | ICD-10-CM | POA: Diagnosis not present

## 2019-08-13 NOTE — Telephone Encounter (Signed)
I do not see letter under media tab or under letters tab Can you please call her ophthalmologist and find out if they were trying to get in touch with [place]  RG

## 2019-08-19 ENCOUNTER — Other Ambulatory Visit: Payer: Self-pay

## 2019-08-19 DIAGNOSIS — K50919 Crohn's disease, unspecified, with unspecified complications: Secondary | ICD-10-CM

## 2019-08-19 MED ORDER — HUMIRA 40 MG/0.8ML ~~LOC~~ PSKT: 40.0000 mg | PREFILLED_SYRINGE | SUBCUTANEOUS | 6 refills | Status: DC

## 2019-08-26 ENCOUNTER — Telehealth: Payer: Self-pay | Admitting: Gastroenterology

## 2019-08-26 NOTE — Telephone Encounter (Signed)
Patient called stating that she wants to have lab test to see if she has an allergy to the silicone in her breast implants.   She has seen her eye doctor( Dr Manuella Ghazi, and her PCP Dr Laqueta Due) Dr. Laqueta Due told her to contact you regarding testing for allergy.  Dr. Laqueta Due is supposed to sent notes.  Please see Dr. Janyth Pupa note in epic. Patient states you will remember her as Brooke Holmes.  Thanks, Peter Congo

## 2019-09-02 NOTE — Telephone Encounter (Signed)
Tell Brooke Holmes-no definite blood tests to determine if she is allergic to silicone Would be a good idea to get in touch with Brooke Holmes (he is the allergy doctor in [place]). We can certainly make an appointment with him.  RG

## 2019-09-03 NOTE — Telephone Encounter (Signed)
Left message on patients voicemail regarding questionable silicone allergy. Per Dr Lyndel Safe rcommend an appointment with Dr. Neldon Mc in [place]. Per Dr Lyndel Safe no definite blood test to determine silicone allergy.

## 2019-09-09 ENCOUNTER — Other Ambulatory Visit: Payer: Self-pay | Admitting: Gastroenterology

## 2019-09-16 DIAGNOSIS — H35033 Hypertensive retinopathy, bilateral: Secondary | ICD-10-CM | POA: Diagnosis not present

## 2019-09-16 DIAGNOSIS — K5080 Crohn's disease of both small and large intestine without complications: Secondary | ICD-10-CM | POA: Diagnosis not present

## 2019-09-16 DIAGNOSIS — H15003 Unspecified scleritis, bilateral: Secondary | ICD-10-CM | POA: Diagnosis not present

## 2019-09-16 DIAGNOSIS — Z79899 Other long term (current) drug therapy: Secondary | ICD-10-CM | POA: Diagnosis not present

## 2019-09-24 DIAGNOSIS — Z79899 Other long term (current) drug therapy: Secondary | ICD-10-CM | POA: Diagnosis not present

## 2019-09-24 DIAGNOSIS — K5090 Crohn's disease, unspecified, without complications: Secondary | ICD-10-CM | POA: Diagnosis not present

## 2019-09-24 DIAGNOSIS — H15003 Unspecified scleritis, bilateral: Secondary | ICD-10-CM | POA: Diagnosis not present

## 2019-09-24 DIAGNOSIS — R202 Paresthesia of skin: Secondary | ICD-10-CM | POA: Diagnosis not present

## 2019-09-24 DIAGNOSIS — I10 Essential (primary) hypertension: Secondary | ICD-10-CM | POA: Diagnosis not present

## 2019-10-01 ENCOUNTER — Encounter: Payer: Self-pay | Admitting: Neurology

## 2019-10-01 ENCOUNTER — Ambulatory Visit (INDEPENDENT_AMBULATORY_CARE_PROVIDER_SITE_OTHER): Payer: BC Managed Care – PPO | Admitting: Neurology

## 2019-10-01 ENCOUNTER — Telehealth: Payer: Self-pay | Admitting: Neurology

## 2019-10-01 ENCOUNTER — Other Ambulatory Visit: Payer: Self-pay

## 2019-10-01 DIAGNOSIS — F0781 Postconcussional syndrome: Secondary | ICD-10-CM

## 2019-10-01 HISTORY — DX: Postconcussional syndrome: F07.81

## 2019-10-01 MED ORDER — AMPHETAMINE-DEXTROAMPHETAMINE 10 MG PO TABS: 10.0000 mg | ORAL_TABLET | Freq: Every day | ORAL | 0 refills | Status: DC

## 2019-10-01 NOTE — Telephone Encounter (Signed)
[employer] Auth: [id number] (exp. 10/01/19 to 10/30/19) order sent to GI. Sent to GI it is cheaper there with her insurance. They will reach out to the patient to schedule.

## 2019-10-01 NOTE — Patient Instructions (Signed)
We will start adderall 10 mg in the morning for focus.
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
## 2019-10-02 ENCOUNTER — Telehealth: Payer: Self-pay | Admitting: Gastroenterology

## 2019-10-02 NOTE — Telephone Encounter (Signed)
Pt's husband Remo Lipps called on behalf of pt, he states that pt has been experiencing a crohns flare up. He states that pt has bloodshot eyes so her eye doctor told her to stop humira as he thought that that was causing that reaction and put pt on a different medication. Pt's husband states that her sxs began after taking that med, she has been in a lot of pain for about a week and pain has progressed since yesterday. Pt does not want to go to the hospital due to concerns about Covid so would like to be seen today or tomorrow. Pls call pt's husband.

## 2019-10-02 NOTE — Telephone Encounter (Signed)
Spoke to patients husband this morning. He was advised by Dr Lyndel Safe to go to the ED for evaluation. The husband stated"My wife is very hardheaded and may not go" He was highly encouraged and informed of the medical consequences if she chooses not to. The husband voiced understanding and will inform patient of this.

## 2019-10-02 NOTE — Telephone Encounter (Signed)
Spoke to patient's husband Corky Mull reports last month patients eyes becoming  blood shot. She went to her eye doctor (he does not remember his name) and he discontinued her Humria, and started her on new medication,the husband does not know the names of new medication. She has missed 2 doses of Humira. Over the past week patient has developed a fever of 101* that comes and goes. She is lethargic,unable to get out of bed and appears more confused, unable to finish her thoughts. Her abdomen is very distended and firm to touch,her eyes are still bloodshot  She has not eaten solid foods in a few days,but is tolerating liquids. The husband was not able to report on her bowel movements. He was advised to take patient to the nearest ED for evaluation. Dr Lyndel Safe the husband said they will be going to [facility] for evaluation.

## 2019-10-02 NOTE — Telephone Encounter (Signed)
Thanks She needs to be evaluated in the ED May require labs and CT.  RG

## 2019-10-03 ENCOUNTER — Emergency Department (HOSPITAL_COMMUNITY)
Admission: EM | Admit: 2019-10-03 | Discharge: 2019-10-03 | Disposition: A | Discharge status: Home / Self Care | Payer: BC Managed Care – PPO | Attending: Emergency Medicine | Admitting: Emergency Medicine

## 2019-10-03 ENCOUNTER — Other Ambulatory Visit: Payer: Self-pay

## 2019-10-03 ENCOUNTER — Encounter (HOSPITAL_COMMUNITY): Payer: Self-pay

## 2019-10-03 ENCOUNTER — Emergency Department (HOSPITAL_COMMUNITY): Payer: BC Managed Care – PPO

## 2019-10-03 DIAGNOSIS — N200 Calculus of kidney: Secondary | ICD-10-CM | POA: Diagnosis not present

## 2019-10-03 DIAGNOSIS — R1032 Left lower quadrant pain: Secondary | ICD-10-CM | POA: Diagnosis not present

## 2019-10-03 DIAGNOSIS — Z79899 Other long term (current) drug therapy: Secondary | ICD-10-CM | POA: Insufficient documentation

## 2019-10-03 DIAGNOSIS — Z87891 Personal history of nicotine dependence: Secondary | ICD-10-CM | POA: Diagnosis not present

## 2019-10-03 DIAGNOSIS — I10 Essential (primary) hypertension: Secondary | ICD-10-CM | POA: Diagnosis not present

## 2019-10-03 DIAGNOSIS — M791 Myalgia, unspecified site: Secondary | ICD-10-CM

## 2019-10-03 DIAGNOSIS — H109 Unspecified conjunctivitis: Secondary | ICD-10-CM

## 2019-10-03 DIAGNOSIS — K6289 Other specified diseases of anus and rectum: Secondary | ICD-10-CM | POA: Diagnosis not present

## 2019-10-03 DIAGNOSIS — R531 Weakness: Secondary | ICD-10-CM | POA: Insufficient documentation

## 2019-10-03 DIAGNOSIS — N3289 Other specified disorders of bladder: Secondary | ICD-10-CM | POA: Diagnosis not present

## 2019-10-03 HISTORY — DX: Noninfective gastroenteritis and colitis, unspecified: K52.9

## 2019-10-03 LAB — URINALYSIS, ROUTINE W REFLEX MICROSCOPIC
Bilirubin Urine: NEGATIVE
Glucose, UA: NEGATIVE mg/dL
Hgb urine dipstick: NEGATIVE
Ketones, ur: NEGATIVE mg/dL
Leukocytes,Ua: NEGATIVE
Nitrite: NEGATIVE
Protein, ur: NEGATIVE mg/dL
Specific Gravity, Urine: 1.014 (ref 1.005–1.030)
pH: 5 (ref 5.0–8.0)

## 2019-10-03 LAB — CBC
HCT: 36.3 % (ref 36.0–46.0)
Hemoglobin: 12.2 g/dL (ref 12.0–15.0)
MCH: 33.1 pg (ref 26.0–34.0)
MCHC: 33.6 g/dL (ref 30.0–36.0)
MCV: 98.4 fL (ref 80.0–100.0)
Platelets: 424 10*3/uL — ABNORMAL HIGH (ref 150–400)
RBC: 3.69 MIL/uL — ABNORMAL LOW (ref 3.87–5.11)
RDW: 12.5 % (ref 11.5–15.5)
WBC: 6.2 10*3/uL (ref 4.0–10.5)
nRBC: 0 % (ref 0.0–0.2)

## 2019-10-03 LAB — COMPREHENSIVE METABOLIC PANEL
ALT: 15 U/L (ref 0–44)
AST: 18 U/L (ref 15–41)
Albumin: 3.8 g/dL (ref 3.5–5.0)
Alkaline Phosphatase: 70 U/L (ref 38–126)
Anion gap: 9 (ref 5–15)
BUN: 16 mg/dL (ref 6–20)
CO2: 25 mmol/L (ref 22–32)
Calcium: 9.3 mg/dL (ref 8.9–10.3)
Chloride: 99 mmol/L (ref 98–111)
Creatinine, Ser: 0.65 mg/dL (ref 0.44–1.00)
GFR calc Af Amer: >60 mL/min (ref >60)
GFR calc non Af Amer: >60 mL/min (ref >60)
Glucose, Bld: 106 mg/dL — ABNORMAL HIGH (ref 70–99)
Potassium: 4.2 mmol/L (ref 3.5–5.1)
Sodium: 133 mmol/L — ABNORMAL LOW (ref 135–145)
Total Bilirubin: 0.7 mg/dL (ref 0.3–1.2)
Total Protein: 8 g/dL (ref 6.5–8.1)

## 2019-10-03 LAB — SEDIMENTATION RATE: Sed Rate: 92 mm/hr — ABNORMAL HIGH (ref 0–22)

## 2019-10-03 LAB — LIPASE, BLOOD: Lipase: 41 U/L (ref 11–51)

## 2019-10-03 MED ORDER — SODIUM CHLORIDE (PF) 0.9 % IJ SOLN
INTRAMUSCULAR | Status: AC
  Filled 2019-10-03: qty 50

## 2019-10-03 MED ORDER — SODIUM CHLORIDE 0.9 % IV BOLUS
1000.0000 mL | Freq: Once | INTRAVENOUS | Status: AC
  Administered 2019-10-03: 1000 mL via INTRAVENOUS

## 2019-10-03 MED ORDER — AMOXICILLIN-POT CLAVULANATE 500-125 MG PO TABS: 1.0000 | ORAL_TABLET | Freq: Three times a day (TID) | ORAL | 0 refills | Status: DC

## 2019-10-03 MED ORDER — IOHEXOL 300 MG/ML  SOLN
100.0000 mL | Freq: Once | INTRAMUSCULAR | Status: AC | PRN
  Administered 2019-10-03: 100 mL via INTRAVENOUS

## 2019-10-03 MED ORDER — PREDNISONE 10 MG PO TABS: 20.0000 mg | ORAL_TABLET | Freq: Two times a day (BID) | ORAL | 0 refills | Status: DC

## 2019-10-03 NOTE — Telephone Encounter (Signed)
Pt is requesting a call back from a nurse, pt's husband states the ED is not admitting  Pt's at this time and would like some advice on what to do.

## 2019-10-03 NOTE — ED Triage Notes (Addendum)
Patient states she has been taking Azathioprine x 2 months. Patient states she took herself off of Humira which she had been taking for 5 years. Patient states she was having muscle pain and weakness.  Patient c/o having red eyes and muscle pain x 7 months. patient states she has been to her doctor. patient also c/o abdominal bloating x 2 months.
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
## 2019-10-03 NOTE — Telephone Encounter (Signed)
Thanks a lot for doing that She needs to be evaluated in ED That will determine if she requires inpatient treatment. I am surprised that did not come yesterday. RG

## 2019-10-03 NOTE — ED Notes (Signed)
Arrived to room, introduced self to pt, Dr Stark Jock at bedside. Delay in VS

## 2019-10-03 NOTE — Discharge Instructions (Addendum)
Begin taking Augmentin as prescribed.  Fill the prescription for prednisone if you develop worsening abdominal pain or bloody stools.  Follow-up with Dr. Lyndel Safe in the next few days.  A referral has been placed to rheumatology.  You should call on Monday to make arrangements for a follow-up appointment.

## 2019-10-03 NOTE — Telephone Encounter (Signed)
Spoke to patients husband who state they are at [facility] at this time per yesterdays recommendations.patient opted not to go yesterday but decide to this morning due to worsening symptoms. Her eyes are still bloodshot. Abdomen very distended and firm to touch. Unable to eat solid food. She had a small dark stool this morning.She is still lethargic.The husband stated they may leave the hospital if they do not get called back in the next few hours. He was encouraged to stay put. I called the ED prior to calling the patient and they are on an average 5 hour wait time. Patient was informed of this and encouraged to stay since they have been there for 3 hours already. He agreed to wait.

## 2019-10-07 LAB — ANTINUCLEAR ANTIBODIES, IFA: ANA Ab, IFA: NEGATIVE

## 2019-10-08 LAB — RHEUMATOID FACTOR

## 2019-10-09 ENCOUNTER — Ambulatory Visit (INDEPENDENT_AMBULATORY_CARE_PROVIDER_SITE_OTHER): Payer: BC Managed Care – PPO | Admitting: Gastroenterology

## 2019-10-09 ENCOUNTER — Encounter: Payer: Self-pay | Admitting: Gastroenterology

## 2019-10-09 VITALS — BP 134/74 | HR 94 | Ht 65.0 in | Wt 166.4 lb

## 2019-10-09 DIAGNOSIS — K50919 Crohn's disease, unspecified, with unspecified complications: Secondary | ICD-10-CM

## 2019-10-09 MED ORDER — FLUTICASONE PROPIONATE 0.05 % EX CREA: TOPICAL_CREAM | Freq: Two times a day (BID) | CUTANEOUS | 2 refills | Status: AC

## 2019-10-09 NOTE — Patient Instructions (Signed)
If you are age 53 or older, your body mass index should be between 23-30. Your Body mass index is 27.69 kg/m. If this is out of the aforementioned range listed, please consider follow up with your Primary Care Provider.  If you are age 89 or younger, your body mass index should be between 19-25. Your Body mass index is 27.69 kg/m. If this is out of the aformentioned range listed, please consider follow up with your Primary Care Provider.   You have been scheduled for a colonoscopy. Please follow written instructions given to you at your visit today.  Please pick up your prep supplies at the pharmacy within the next 1-3 days. If you use inhalers (even only as needed), please bring them with you on the day of your procedure.   We have sent the following medications to your pharmacy for you to pick up at your convenience: Fluticasone   Follow up in 6 months.   Thank you,  Dr. Jackquline Denmark
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
## 2019-10-15 DIAGNOSIS — Z6828 Body mass index (BMI) 28.0-28.9, adult: Secondary | ICD-10-CM | POA: Diagnosis not present

## 2019-10-15 DIAGNOSIS — Z01419 Encounter for gynecological examination (general) (routine) without abnormal findings: Secondary | ICD-10-CM | POA: Diagnosis not present

## 2019-10-16 DIAGNOSIS — Z01419 Encounter for gynecological examination (general) (routine) without abnormal findings: Secondary | ICD-10-CM | POA: Diagnosis not present

## 2019-10-22 DIAGNOSIS — Z1231 Encounter for screening mammogram for malignant neoplasm of breast: Secondary | ICD-10-CM | POA: Diagnosis not present

## 2019-10-28 ENCOUNTER — Telehealth: Payer: Self-pay | Admitting: Gastroenterology

## 2019-10-28 NOTE — Telephone Encounter (Signed)
Pt is requesting for another round of antibiotics amoxicillin to take care of the infection.

## 2019-10-28 NOTE — Telephone Encounter (Signed)
Patient wanted antibiotic for her eyes. I have instructed her to call her primary care doctor.

## 2019-11-14 ENCOUNTER — Ambulatory Visit: Payer: BC Managed Care – PPO | Admitting: Gastroenterology

## 2019-11-16 DIAGNOSIS — R82998 Other abnormal findings in urine: Secondary | ICD-10-CM | POA: Diagnosis not present

## 2019-11-18 ENCOUNTER — Encounter: Payer: BC Managed Care – PPO | Admitting: Counselor

## 2019-11-26 ENCOUNTER — Encounter: Payer: BC Managed Care – PPO | Admitting: Counselor

## 2019-11-28 ENCOUNTER — Encounter: Payer: Self-pay | Admitting: Gastroenterology

## 2019-12-04 ENCOUNTER — Encounter: Payer: BC Managed Care – PPO | Admitting: Counselor

## 2019-12-09 ENCOUNTER — Encounter: Payer: BC Managed Care – PPO | Admitting: Counselor

## 2019-12-11 ENCOUNTER — Encounter: Payer: Self-pay | Admitting: Certified Registered Nurse Anesthetist

## 2019-12-12 ENCOUNTER — Encounter: Payer: Self-pay | Admitting: Gastroenterology

## 2019-12-12 ENCOUNTER — Other Ambulatory Visit: Payer: Self-pay

## 2019-12-12 ENCOUNTER — Ambulatory Visit (AMBULATORY_SURGERY_CENTER): Payer: BC Managed Care – PPO | Admitting: Gastroenterology

## 2019-12-12 VITALS — BP 127/79 | HR 67 | Temp 96.9°F | Resp 19 | Ht 65.0 in | Wt 166.0 lb

## 2019-12-12 DIAGNOSIS — D128 Benign neoplasm of rectum: Secondary | ICD-10-CM

## 2019-12-12 DIAGNOSIS — K50919 Crohn's disease, unspecified, with unspecified complications: Secondary | ICD-10-CM

## 2019-12-12 DIAGNOSIS — D125 Benign neoplasm of sigmoid colon: Secondary | ICD-10-CM

## 2019-12-12 DIAGNOSIS — K5150 Left sided colitis without complications: Secondary | ICD-10-CM | POA: Diagnosis not present

## 2019-12-12 DIAGNOSIS — K5140 Inflammatory polyps of colon without complications: Secondary | ICD-10-CM | POA: Diagnosis not present

## 2019-12-12 DIAGNOSIS — K5090 Crohn's disease, unspecified, without complications: Secondary | ICD-10-CM | POA: Diagnosis not present

## 2019-12-12 MED ORDER — SODIUM CHLORIDE 0.9 % IV SOLN: 500.0000 mL | Freq: Once | INTRAVENOUS | Status: DC

## 2019-12-12 NOTE — Op Note (Signed)
[facility] Patient Name: Brooke Holmes Procedure Date: 12/12/2019 8:29 AM MRN: [id number] Endoscopist: Jackquline Denmark , MD Age: 54 Referring MD:  Date of Birth: Jun 27, 1965 Gender: Female Account #: [id number] Procedure:                Colonoscopy Indications:              Crohn's colitis. Dx 2004, involving transverse/desc                            colon with Nl TI. Rx with steroids, Asacol and then                            Lialda. Nl TPMT, neg TB test. Started Humira 03/2014                            with remission. S/p Pneumovax 01/2014. Last colon                            07/2015 mild Crohn's colitis. Normal TI. No perianal                            Crohn's. Stopped humira 08/2019 on her own d/t                            muscle pain/weakness. She wanted to hold off on                            Biologics/steroids at this time. Medicines:                Monitored Anesthesia Care Procedure:                Pre-Anesthesia Assessment:                           - Prior to the procedure, a History and Physical                            was performed, and patient medications and                            allergies were reviewed. The patient's tolerance of                            previous anesthesia was also reviewed. The risks                            and benefits of the procedure and the sedation                            options and risks were discussed with the patient.                            All questions were answered, and informed consent  was obtained. Prior Anticoagulants: The patient has                            taken no previous anticoagulant or antiplatelet                            agents. ASA Grade Assessment: II - A patient with                            mild systemic disease. After reviewing the risks                            and benefits, the patient was deemed in                            satisfactory condition to  undergo the procedure.                           After obtaining informed consent, the colonoscope                            was passed under direct vision. Throughout the                            procedure, the patient's blood pressure, pulse, and                            oxygen saturations were monitored continuously. The                            Colonoscope was introduced through the anus and                            advanced to the 4 cm into the ileum. The                            colonoscopy was performed without difficulty. The                            patient tolerated the procedure well. The quality                            of the bowel preparation was good. The terminal                            ileum, ileocecal valve, appendiceal orifice, and                            rectum were photographed. Scope In: 8:43:19 AM Scope Out: 9:05:25 AM Scope Withdrawal Time: 0 hours 18 minutes 0 seconds  Total Procedure Duration: 0 hours 22 minutes 6 seconds  Findings:                 Several aphthous ulcers and some confluent ulcers  were noted in the sigmoid, descending and distal                            transverse colon. A stricture was noted in the                            descending colon which did allow passage of                            pediatric colonoscope. TI, right colon, and mid                            transverse colon was normal. Rectum was normal as                            well.                           Simple Endoscopic Score for Crohn's Disease was                            determined based on the endoscopic appearance of                            the mucosa in the following segments:                           - Ileum: Findings include no ulcers present, no                            ulcerated surfaces, no affected surfaces and no                            narrowings. Biopsies were obtained.                           -  Right Colon: Findings include no ulcers present,                            no ulcerated surfaces, no affected surfaces, no                            narrowings and no ulcers present, no ulcerated                            surfaces, no affected surfaces and no narrowings.                            Biopsies were obtained.                           - Transverse Colon: Findings include aphthous                            ulcers  less than 0.5 cm in size, less than 10%                            ulcerated surfaces, less than 50% of surfaces                            affected and no narrowings. Mainly in distal                            transverse colon. Biopsies were obtained.                           - Left Colon: Findings include large ulcers 0.5-2                            cm in size, 10-30% ulcerated surfaces, less than                            50% of surfaces affected and a single narrowing                            that can be passed. Biopsies were obtained.                           - Rectum: Findings include no ulcers present, no                            ulcerated surfaces, no affected surfaces and no                            narrowings.                           3 small 4 to 6 mm polyps were noted in the rectum                            and mid sigmoid colon. These were removed by cold                            biopsy forceps.                           Hemorrhoids-internal and external with posterior                            rectal fissure and ? Condylomata were found on                            perianal exam. There were no obvious masses.                           The exam was otherwise without abnormality on  direct and retroflexion views. Complications:            No immediate complications. Estimated Blood Loss:     Estimated blood loss: none. Impression:               -Crohn's colitis -involving distal transverse                             colon, descending colon and sigmoid colon. Nl TI                           -Internal/external hemorrhoids with likely healed                            posterior anal fissure. No definite perianal                            Crohn's. ?Condylomata Recommendation:           - Patient has a contact number available for                            emergencies. The signs and symptoms of potential                            delayed complications were discussed with the                            patient. Return to normal activities tomorrow.                            Written discharge instructions were provided to the                            patient.                           - Resume previous diet.                           - Increase Imuran to 100 mg p.o. once a day                           - Await pathology results. Would recommend biologic                            therapy. We will discuss more at follow-up visit.                           - No aspirin, ibuprofen, naproxen, or other                            non-steroidal anti-inflammatory drugs.                           - If any anorectal problems, would recommend  surgical consultation for possible                            EUA/hemorrhoidectomy/further evaluation.                           - Return to GI clinic in 4 weeks.                           - D/W pt in detail. Jackquline Denmark, MD 12/12/2019 9:22:50 AM This report has been signed electronically.

## 2019-12-12 NOTE — Progress Notes (Signed)
Called to room to assist during endoscopic procedure.  Patient ID and intended procedure confirmed with present staff. Received instructions for my participation in the procedure from the performing physician.  

## 2019-12-12 NOTE — Progress Notes (Signed)
[facility] 

## 2019-12-12 NOTE — Patient Instructions (Signed)
HANDOUTS PROVIDED ON: POLYPS & HEMORRHOIDS  The polyps removed/biopsies taken today have been sent for pathology.  The results can take 1-3 weeks to receive.  When your next colonoscopy should occur will be based on the pathology results.    You may resume your previous diet and medication schedule. NO ASPIRIN, IBUPROFEN, NAPROXEN, OR ANY OTHER NSAIDs.  Thank you for allowing [place] to care for you today!!!   YOU HAD AN ENDOSCOPIC PROCEDURE TODAY AT [facility]:   Refer to the procedure report that was given to you for any specific questions about what was found during the examination.  If the procedure report does not answer your questions, please call your gastroenterologist to clarify.  If you requested that your care partner not be given the details of your procedure findings, then the procedure report has been included in a sealed envelope for you to review at your convenience later.  YOU SHOULD EXPECT: Some feelings of bloating in the abdomen. Passage of more gas than usual.  Walking can help get rid of the air that was put into your GI tract during the procedure and reduce the bloating. If you had a lower endoscopy (such as a colonoscopy or flexible sigmoidoscopy) you may notice spotting of blood in your stool or on the toilet paper. If you underwent a bowel prep for your procedure, you may not have a normal bowel movement for a few days.  Please Note:  You might notice some irritation and congestion in your nose or some drainage.  This is from the oxygen used during your procedure.  There is no need for concern and it should clear up in a day or so.  SYMPTOMS TO REPORT IMMEDIATELY:   Following lower endoscopy (colonoscopy or flexible sigmoidoscopy):  Excessive amounts of blood in the stool  Significant tenderness or worsening of abdominal pains  Swelling of the abdomen that is new, acute  Fever of 100F or higher  For urgent or emergent issues, a gastroenterologist can be  reached at any hour by calling [phone]. Do not use MyChart messaging for urgent concerns.    DIET:  We do recommend a small meal at first, but then you may proceed to your regular diet.  Drink plenty of fluids but you should avoid alcoholic beverages for 24 hours.  ACTIVITY:  You should plan to take it easy for the rest of today and you should NOT DRIVE or use heavy machinery until tomorrow (because of the sedation medicines used during the test).    FOLLOW UP: Our staff will call the number listed on your records Tuesday morning between 7:15 am and 8:15 am to check on you and address any questions or concerns that you may have regarding the information given to you following your procedure. If we do not reach you, we will leave a message.  We will attempt to reach you two times.  During this call, we will ask if you have developed any symptoms of COVID 19. If you develop any symptoms (ie: fever, flu-like symptoms, shortness of breath, cough etc.) before then, please call [phone].  If you test positive for Covid 19 in the 2 weeks post procedure, please call and report this information to [place].    If any biopsies were taken you will be contacted by phone or by letter within the next 1-3 weeks.  Please call us at [phone] if you have not heard about the biopsies in 3 weeks.  SIGNATURES/CONFIDENTIALITY: You and/or your care partner have signed paperwork which will be entered into your electronic medical record.  These signatures attest to the fact that that the information above on your After Visit Summary has been reviewed and is understood.  Full responsibility of the confidentiality of this discharge information lies with you and/or your care-partner.

## 2019-12-12 NOTE — Progress Notes (Signed)
Report given to PACU, vss 

## 2019-12-16 ENCOUNTER — Encounter: Payer: BC Managed Care – PPO | Admitting: Counselor

## 2019-12-16 ENCOUNTER — Telehealth: Payer: Self-pay

## 2019-12-16 NOTE — Telephone Encounter (Signed)
  Follow up Call-  Call back number 12/12/2019  Post procedure Call Back phone  # [phone]  Permission to leave phone message Yes     Patient questions:  Do you have a fever, pain , or abdominal swelling? No. Pain Score  0 *  Have you tolerated food without any problems? Yes.    Have you been able to return to your normal activities? Yes.    Do you have any questions about your discharge instructions: Diet   No. Medications  No. Follow up visit  No.  Do you have questions or concerns about your Care? No.  Actions: * If pain score is 4 or above: No action needed, pain <4.  1. Have you developed a fever since your procedure? no  2.   Have you had an respiratory symptoms (SOB or cough) since your procedure? no  3.   Have you tested positive for COVID 19 since your procedure no  4.   Have you had any family members/close contacts diagnosed with the COVID 19 since your procedure?  no   If yes to any of these questions please route to Joylene John, RN and Joella Prince, RN

## 2019-12-21 ENCOUNTER — Encounter: Payer: Self-pay | Admitting: Gastroenterology

## 2019-12-21 NOTE — Progress Notes (Signed)
Check CBC, CMP, CRP, TB Gold, HBsAg, anti-HBsAb. Start stelara 323m IV x 1 as induction dose, then after 8 weeks, 943mSQ every 8 weekly. FU in 12 weeks with me or APP clinic.  Left detailed message on her ANS machine.

## 2019-12-22 ENCOUNTER — Telehealth: Payer: Self-pay | Admitting: Gastroenterology

## 2019-12-22 ENCOUNTER — Other Ambulatory Visit: Payer: Self-pay | Admitting: Gastroenterology

## 2019-12-22 DIAGNOSIS — K50919 Crohn's disease, unspecified, with unspecified complications: Secondary | ICD-10-CM

## 2019-12-30 DIAGNOSIS — I10 Essential (primary) hypertension: Secondary | ICD-10-CM | POA: Diagnosis not present

## 2019-12-30 DIAGNOSIS — Z6828 Body mass index (BMI) 28.0-28.9, adult: Secondary | ICD-10-CM | POA: Diagnosis not present

## 2019-12-30 DIAGNOSIS — K5090 Crohn's disease, unspecified, without complications: Secondary | ICD-10-CM | POA: Diagnosis not present

## 2019-12-30 DIAGNOSIS — G4700 Insomnia, unspecified: Secondary | ICD-10-CM | POA: Diagnosis not present

## 2020-01-02 ENCOUNTER — Telehealth: Payer: Self-pay | Admitting: Gastroenterology

## 2020-01-06 NOTE — Telephone Encounter (Signed)
Pt returned your call, pls call her again at [phone].

## 2020-01-07 NOTE — Telephone Encounter (Signed)
Patient has stopped taking Humira several months ago,she continues on Imuran 50  mg twice daily. Patient would like to know if there was anything else she needed to take in addition.

## 2020-01-07 NOTE — Telephone Encounter (Signed)
Patient called again requesting to be called back at [phone].

## 2020-01-08 NOTE — Telephone Encounter (Signed)
Spoke to patient who was supposed to come in for labs when she got back into town,and has decided not to go back on a biologic,but would like to continue on the Imuran only. Patient states that she will call the office in a few months if she decides to have her labs done and start on Stelara.

## 2020-01-08 NOTE — Telephone Encounter (Signed)
Thanks for letting me know RG

## 2020-01-08 NOTE — Telephone Encounter (Signed)
Pl see my 11/12 progress note. This is in addition to Imuran   RG

## 2020-01-08 NOTE — Telephone Encounter (Signed)
LMOM for patient to call back.

## 2020-01-26 ENCOUNTER — Other Ambulatory Visit (INDEPENDENT_AMBULATORY_CARE_PROVIDER_SITE_OTHER): Payer: BC Managed Care – PPO

## 2020-01-26 DIAGNOSIS — K50919 Crohn's disease, unspecified, with unspecified complications: Secondary | ICD-10-CM

## 2020-01-26 LAB — CBC WITH DIFFERENTIAL/PLATELET
Basophils Absolute: 0 10*3/uL (ref 0.0–0.1)
Basophils Relative: 0.7 % (ref 0.0–3.0)
Eosinophils Absolute: 0 10*3/uL (ref 0.0–0.7)
Eosinophils Relative: 1.1 % (ref 0.0–5.0)
HCT: 38.1 % (ref 36.0–46.0)
Hemoglobin: 13.2 g/dL (ref 12.0–15.0)
Lymphocytes Relative: 32.7 % (ref 12.0–46.0)
Lymphs Abs: 1.2 10*3/uL (ref 0.7–4.0)
MCHC: 34.6 g/dL (ref 30.0–36.0)
MCV: 94.4 fl (ref 78.0–100.0)
Monocytes Absolute: 0.4 10*3/uL (ref 0.1–1.0)
Monocytes Relative: 10.8 % (ref 3.0–12.0)
Neutro Abs: 2 10*3/uL (ref 1.4–7.7)
Neutrophils Relative %: 54.7 % (ref 43.0–77.0)
Platelets: 245 10*3/uL (ref 150.0–400.0)
RBC: 4.04 Mil/uL (ref 3.87–5.11)
RDW: 13.6 % (ref 11.5–15.5)
WBC: 3.6 10*3/uL — ABNORMAL LOW (ref 4.0–10.5)

## 2020-01-26 LAB — COMPREHENSIVE METABOLIC PANEL
ALT: 24 U/L (ref 0–35)
AST: 22 U/L (ref 0–37)
Albumin: 4.1 g/dL (ref 3.5–5.2)
Alkaline Phosphatase: 53 U/L (ref 39–117)
BUN: 18 mg/dL (ref 6–23)
CO2: 26 mEq/L (ref 19–32)
Calcium: 9.6 mg/dL (ref 8.4–10.5)
Chloride: 100 mEq/L (ref 96–112)
Creatinine, Ser: 0.79 mg/dL (ref 0.40–1.20)
GFR: 85.25 mL/min (ref >60.00)
Glucose, Bld: 112 mg/dL — ABNORMAL HIGH (ref 70–99)
Potassium: 3.6 mEq/L (ref 3.5–5.1)
Sodium: 135 mEq/L (ref 135–145)
Total Bilirubin: 1 mg/dL (ref 0.2–1.2)
Total Protein: 7.7 g/dL (ref 6.0–8.3)

## 2020-01-26 LAB — HIGH SENSITIVITY CRP: CRP, High Sensitivity: 2.24 mg/L (ref 0.000–5.000)

## 2020-01-28 LAB — QUANTIFERON-TB GOLD PLUS
Mitogen-NIL: >10 IU/mL
NIL: 0.02 IU/mL
QuantiFERON-TB Gold Plus: NEGATIVE
TB1-NIL: 0 IU/mL
TB2-NIL: 0 IU/mL

## 2020-01-28 LAB — HEPATITIS B SURFACE ANTIBODY,QUALITATIVE: Hep B S Ab: NONREACTIVE

## 2020-01-28 LAB — HEPATITIS B SURFACE ANTIGEN: Hepatitis B Surface Ag: NONREACTIVE

## 2020-01-28 NOTE — Telephone Encounter (Signed)
LMOM for patient to call back.

## 2020-01-28 NOTE — Telephone Encounter (Signed)
Pt is requesting a call back from a nurse regarding her lab results.

## 2020-01-29 ENCOUNTER — Telehealth: Payer: Self-pay

## 2020-01-29 NOTE — Telephone Encounter (Signed)
Spoke to patient who states she is ready to start Stelara 390 mg single IV induction then Maintenance dose of Stelara 90 mg every 8 weeks.All necessary forms filled out and ready to fax to [facility] infusion once signed by Dr Lyndel Safe. He will be back in the office next week.

## 2020-01-29 NOTE — Telephone Encounter (Signed)
Spoke to patient see encounter note.

## 2020-01-29 NOTE — Telephone Encounter (Signed)
LMOM for patient top call back

## 2020-01-29 NOTE — Telephone Encounter (Signed)
Inbound call from patient returning your call.

## 2020-02-19 NOTE — Telephone Encounter (Signed)
Inbound call from patient stating she would like to have Stelara medication so she can administer it herself instead.  Please advise.

## 2020-02-20 ENCOUNTER — Other Ambulatory Visit: Payer: Self-pay | Admitting: Gastroenterology

## 2020-02-20 NOTE — Telephone Encounter (Signed)
Faxed over Stelara enrollment order form to [facility] for maintenance  dose Stelara 90 ms SQ every 8 weeks

## 2020-02-25 DIAGNOSIS — K50919 Crohn's disease, unspecified, with unspecified complications: Secondary | ICD-10-CM | POA: Diagnosis not present

## 2020-03-03 NOTE — Telephone Encounter (Signed)
Lets send her to ophthalmology Could have episcleritis/scleritis  RG

## 2020-03-03 NOTE — Telephone Encounter (Signed)
Pt is requesting a call back from a nurse regarding her Stelara

## 2020-03-03 NOTE — Telephone Encounter (Signed)
Spoke to patient who reports starting her first Stelara infusion 02/25/20,then will do SQ every 8 weeks.She states that both of the whites of her eyes are red. No drainage. Patient is requesting medication eye drops if indicated.

## 2020-03-04 NOTE — Telephone Encounter (Signed)
Patient has an ophthalmologist that she currently see's. She will contact there office for an appointment.

## 2020-03-10 ENCOUNTER — Encounter: Payer: Self-pay | Admitting: Gastroenterology

## 2020-03-10 ENCOUNTER — Ambulatory Visit (INDEPENDENT_AMBULATORY_CARE_PROVIDER_SITE_OTHER): Payer: BC Managed Care – PPO | Admitting: Gastroenterology

## 2020-03-10 VITALS — BP 160/88 | HR 86 | Ht 65.0 in | Wt 177.5 lb

## 2020-03-10 DIAGNOSIS — H15003 Unspecified scleritis, bilateral: Secondary | ICD-10-CM

## 2020-03-10 DIAGNOSIS — K5080 Crohn's disease of both small and large intestine without complications: Secondary | ICD-10-CM

## 2020-03-10 MED ORDER — AMOXICILLIN 500 MG PO TABS: 500.0000 mg | ORAL_TABLET | Freq: Three times a day (TID) | ORAL | 0 refills | Status: AC

## 2020-03-10 NOTE — Patient Instructions (Addendum)
If you are age 54 or older, your body mass index should be between 23-30. Your Body mass index is 29.54 kg/m. If this is out of the aforementioned range listed, please consider follow up with your Primary Care Provider.  If you are age 58 or younger, your body mass index should be between 19-25. Your Body mass index is 29.54 kg/m. If this is out of the aformentioned range listed, please consider follow up with your Primary Care Provider.   We have sent the following medications to your pharmacy for you to pick up at your convenience: Amoxicillin 500 mg three times a day for 10 days   Continue b12 monthly.  Follow up in 6 months   Due to recent changes in healthcare laws, you may see the results of your imaging and laboratory studies on MyChart before your provider has had a chance to review them.  We understand that in some cases there may be results that are confusing or concerning to you. Not all laboratory results come back in the same time frame and the provider may be waiting for multiple results in order to interpret others.  Please give [place] 48 hours in order for your provider to thoroughly review all the results before contacting the office for clarification of your results.
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
## 2020-03-17 ENCOUNTER — Ambulatory Visit: Payer: BC Managed Care – PPO | Admitting: Gastroenterology
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
## 2020-03-31 ENCOUNTER — Encounter: Payer: Self-pay | Admitting: Neurology

## 2020-03-31 ENCOUNTER — Ambulatory Visit (INDEPENDENT_AMBULATORY_CARE_PROVIDER_SITE_OTHER): Payer: BC Managed Care – PPO | Admitting: Neurology

## 2020-03-31 VITALS — BP 132/87 | HR 88 | Ht 65.0 in | Wt 164.0 lb

## 2020-03-31 DIAGNOSIS — R9082 White matter disease, unspecified: Secondary | ICD-10-CM | POA: Diagnosis not present

## 2020-03-31 DIAGNOSIS — F0781 Postconcussional syndrome: Secondary | ICD-10-CM

## 2020-03-31 NOTE — Patient Instructions (Signed)
Will consider MRI at next visit  Hold off on Adderall for now I am glad you are improving  See you back in 1 year

## 2020-03-31 NOTE — Progress Notes (Signed)
I have read the note, and I agree with the clinical assessment and plan.  Charles K Willis   

## 2020-04-06 DIAGNOSIS — K5090 Crohn's disease, unspecified, without complications: Secondary | ICD-10-CM | POA: Diagnosis not present

## 2020-04-06 DIAGNOSIS — I10 Essential (primary) hypertension: Secondary | ICD-10-CM | POA: Diagnosis not present

## 2020-04-06 DIAGNOSIS — G4700 Insomnia, unspecified: Secondary | ICD-10-CM | POA: Diagnosis not present

## 2020-04-06 DIAGNOSIS — R9082 White matter disease, unspecified: Secondary | ICD-10-CM | POA: Diagnosis not present

## 2020-04-21 DIAGNOSIS — K50919 Crohn's disease, unspecified, with unspecified complications: Secondary | ICD-10-CM | POA: Diagnosis not present

## 2020-04-25 DIAGNOSIS — R300 Dysuria: Secondary | ICD-10-CM | POA: Diagnosis not present

## 2020-04-25 DIAGNOSIS — B379 Candidiasis, unspecified: Secondary | ICD-10-CM | POA: Diagnosis not present

## 2020-04-25 DIAGNOSIS — A499 Bacterial infection, unspecified: Secondary | ICD-10-CM | POA: Diagnosis not present

## 2020-04-25 DIAGNOSIS — N390 Urinary tract infection, site not specified: Secondary | ICD-10-CM | POA: Diagnosis not present

## 2020-05-04 DIAGNOSIS — H15003 Unspecified scleritis, bilateral: Secondary | ICD-10-CM | POA: Diagnosis not present

## 2020-05-04 DIAGNOSIS — H35033 Hypertensive retinopathy, bilateral: Secondary | ICD-10-CM | POA: Diagnosis not present

## 2020-05-04 DIAGNOSIS — K5080 Crohn's disease of both small and large intestine without complications: Secondary | ICD-10-CM | POA: Diagnosis not present

## 2020-06-02 ENCOUNTER — Telehealth: Payer: Self-pay | Admitting: Gastroenterology

## 2020-06-02 NOTE — Telephone Encounter (Signed)
Left message for pt to call back  °

## 2020-06-02 NOTE — Telephone Encounter (Signed)
Inbound call from patient. States she have been taking stelara but it is causing constipation. States ex lax is the only the that can help use the bowel movement. She is taking a probiotic (6 a day) and nothing happens.   Also need clearance for procedure called Cryoskin

## 2020-06-04 NOTE — Telephone Encounter (Signed)
Pt reports the stelara has caused some constipation. She reports she took some exlax. Discussed with her she should take miralax for her constipation.   Pt also wants to have a procedure on her thighs to firm up the skin at [facility] and they told her she needed to have medical clearance due to her crohns to have the Cryoskin procedure. Please advise.

## 2020-06-10 NOTE — Telephone Encounter (Signed)
It is good that Stelara is causing constipation. That means the diarrhea has resolved Can proceed with cryo skin procedure RG

## 2020-06-10 NOTE — Telephone Encounter (Signed)
The pt has been advised and asked that a letter get mailed to her stating the skin procedure is ok.  I will mail that today

## 2020-07-13 DIAGNOSIS — I10 Essential (primary) hypertension: Secondary | ICD-10-CM | POA: Diagnosis not present

## 2020-07-13 DIAGNOSIS — B001 Herpesviral vesicular dermatitis: Secondary | ICD-10-CM | POA: Diagnosis not present

## 2020-07-13 DIAGNOSIS — K5090 Crohn's disease, unspecified, without complications: Secondary | ICD-10-CM | POA: Diagnosis not present

## 2020-07-13 DIAGNOSIS — G4700 Insomnia, unspecified: Secondary | ICD-10-CM | POA: Diagnosis not present

## 2020-09-29 ENCOUNTER — Telehealth: Payer: Self-pay | Admitting: Gastroenterology

## 2020-09-29 NOTE — Telephone Encounter (Signed)
Inbound call from patient. Would like a refill for Stelara

## 2020-09-30 NOTE — Telephone Encounter (Signed)
Attempted to contact patient. No answer/ LMTCB

## 2020-10-01 NOTE — Telephone Encounter (Signed)
Attempted to call patient, no answer. LMTCB

## 2020-10-05 NOTE — Telephone Encounter (Signed)
Attempted to contact patient. No answer, LMTCB

## 2020-10-05 NOTE — Telephone Encounter (Signed)
Patient is returning your call.  

## 2020-10-07 NOTE — Telephone Encounter (Signed)
Fourth attempt to contact patient. No answer. LMTCB. Will close encounter at this time

## 2020-10-12 DIAGNOSIS — I10 Essential (primary) hypertension: Secondary | ICD-10-CM | POA: Diagnosis not present

## 2020-10-12 DIAGNOSIS — Z79899 Other long term (current) drug therapy: Secondary | ICD-10-CM | POA: Diagnosis not present

## 2020-10-12 DIAGNOSIS — R140 Abdominal distension (gaseous): Secondary | ICD-10-CM | POA: Diagnosis not present

## 2020-10-12 DIAGNOSIS — K5900 Constipation, unspecified: Secondary | ICD-10-CM | POA: Diagnosis not present

## 2020-10-12 DIAGNOSIS — K5090 Crohn's disease, unspecified, without complications: Secondary | ICD-10-CM | POA: Diagnosis not present

## 2020-10-29 DIAGNOSIS — K5090 Crohn's disease, unspecified, without complications: Secondary | ICD-10-CM | POA: Diagnosis not present

## 2020-10-29 DIAGNOSIS — K760 Fatty (change of) liver, not elsewhere classified: Secondary | ICD-10-CM | POA: Diagnosis not present

## 2020-12-14 DIAGNOSIS — E785 Hyperlipidemia, unspecified: Secondary | ICD-10-CM | POA: Diagnosis not present

## 2020-12-22 DIAGNOSIS — M65839 Other synovitis and tenosynovitis, unspecified forearm: Secondary | ICD-10-CM | POA: Diagnosis not present

## 2020-12-22 DIAGNOSIS — M79642 Pain in left hand: Secondary | ICD-10-CM | POA: Diagnosis not present

## 2020-12-22 DIAGNOSIS — M79641 Pain in right hand: Secondary | ICD-10-CM | POA: Diagnosis not present

## 2020-12-22 DIAGNOSIS — M720 Palmar fascial fibromatosis [Dupuytren]: Secondary | ICD-10-CM | POA: Diagnosis not present

## 2021-01-13 DIAGNOSIS — Z20822 Contact with and (suspected) exposure to covid-19: Secondary | ICD-10-CM | POA: Diagnosis not present

## 2021-01-13 DIAGNOSIS — K5090 Crohn's disease, unspecified, without complications: Secondary | ICD-10-CM | POA: Diagnosis not present

## 2021-02-25 ENCOUNTER — Other Ambulatory Visit (INDEPENDENT_AMBULATORY_CARE_PROVIDER_SITE_OTHER): Payer: BC Managed Care – PPO

## 2021-02-25 ENCOUNTER — Other Ambulatory Visit: Payer: Self-pay

## 2021-02-25 ENCOUNTER — Telehealth: Payer: Self-pay

## 2021-02-25 DIAGNOSIS — K5080 Crohn's disease of both small and large intestine without complications: Secondary | ICD-10-CM

## 2021-02-25 LAB — CBC WITH DIFFERENTIAL/PLATELET
Basophils Absolute: 0.1 K/uL (ref 0.0–0.1)
Basophils Relative: 1.3 % (ref 0.0–3.0)
Eosinophils Absolute: 0.1 K/uL (ref 0.0–0.7)
Eosinophils Relative: 1.2 % (ref 0.0–5.0)
HCT: 38.5 % (ref 36.0–46.0)
Hemoglobin: 13.3 g/dL (ref 12.0–15.0)
Lymphocytes Relative: 38.3 % (ref 12.0–46.0)
Lymphs Abs: 2.3 K/uL (ref 0.7–4.0)
MCHC: 34.4 g/dL (ref 30.0–36.0)
MCV: 94.7 fl (ref 78.0–100.0)
Monocytes Absolute: 0.5 K/uL (ref 0.1–1.0)
Monocytes Relative: 8.8 % (ref 3.0–12.0)
Neutro Abs: 3 K/uL (ref 1.4–7.7)
Neutrophils Relative %: 50.4 % (ref 43.0–77.0)
Platelets: 307 K/uL (ref 150.0–400.0)
RBC: 4.07 Mil/uL (ref 3.87–5.11)
RDW: 13.3 % (ref 11.5–15.5)
WBC: 6 K/uL (ref 4.0–10.5)

## 2021-02-25 LAB — COMPREHENSIVE METABOLIC PANEL WITH GFR
ALT: 22 U/L (ref 0–35)
AST: 21 U/L (ref 0–37)
Albumin: 4.3 g/dL (ref 3.5–5.2)
Alkaline Phosphatase: 80 U/L (ref 39–117)
BUN: 22 mg/dL (ref 6–23)
CO2: 27 meq/L (ref 19–32)
Calcium: 9.9 mg/dL (ref 8.4–10.5)
Chloride: 102 meq/L (ref 96–112)
Creatinine, Ser: 0.69 mg/dL (ref 0.40–1.20)
GFR: 98.16 mL/min
Glucose, Bld: 93 mg/dL (ref 70–99)
Potassium: 4 meq/L (ref 3.5–5.1)
Sodium: 137 meq/L (ref 135–145)
Total Bilirubin: 0.4 mg/dL (ref 0.2–1.2)
Total Protein: 8.1 g/dL (ref 6.0–8.3)

## 2021-02-25 LAB — C-REACTIVE PROTEIN: CRP: <1 mg/dL (ref 0.5–20.0)

## 2021-02-25 NOTE — Telephone Encounter (Signed)
Received a fax from [place] for PA in regard to Pt Brooke Holmes: Pt in need of TB test to complete PA forms due to Last TB test that was done was 01/26/2020. Dr. Lyndel Safe made aware. Orders received to get TB gold, HBsAg, CBC, CMP, CRP Lab Orders placed in Epic; Pt made aware:  Pt also set up for a Follow up Appointment with Dr. Lyndel Safe on 03/30/2021 at 11:20 in [facility]. Pt made aware that it was VERY IMPORTANT for her to have these labs done soon so we can complete paper work for the Pt PA. Pt stated that she would come to the lab this afternoon.  Pt verbalized understanding with all questions answered.

## 2021-03-01 ENCOUNTER — Other Ambulatory Visit: Payer: Self-pay | Admitting: Gastroenterology

## 2021-03-02 LAB — QUANTIFERON-TB GOLD PLUS
Mitogen-NIL: >10 IU/mL
NIL: 0.08 IU/mL
QuantiFERON-TB Gold Plus: NEGATIVE
TB1-NIL: 0.05 IU/mL
TB2-NIL: 0.05 IU/mL

## 2021-03-02 LAB — HEPATITIS B SURFACE ANTIGEN: Hepatitis B Surface Ag: NONREACTIVE

## 2021-03-03 NOTE — Telephone Encounter (Signed)
Received PA approval for Stelara from 03/02/21 to 03/02/21 from [facility].

## 2021-03-18 ENCOUNTER — Other Ambulatory Visit: Payer: Self-pay | Admitting: Orthopedic Surgery

## 2021-03-29 ENCOUNTER — Other Ambulatory Visit: Payer: Self-pay | Admitting: Neurology

## 2021-03-29 NOTE — Telephone Encounter (Signed)
Patient returned my call. She is agreeable to discussing adderall at her visit on 38/2023.

## 2021-03-29 NOTE — Telephone Encounter (Signed)
I would like to discuss the Adderall at her next office visit.

## 2021-03-29 NOTE — Telephone Encounter (Signed)
Pt is requesting a refill for amphetamine-dextroamphetamine (ADDERALL) 10 MG tablet.  Pharmacy: [id number]

## 2021-03-29 NOTE — Telephone Encounter (Signed)
I called patient. It appears that Brooke Roch, NP wanted to hold off on refilling patient's adderall at the last visit unless her condition worsened.   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  She will keep her upcoming appointment on 04/06/2021.  [place] Controlled Substance Registry checked and is appropriate.

## 2021-03-29 NOTE — Telephone Encounter (Signed)
I called patient to discuss. No answer, left a message asking her to call us back.

## 2021-03-30 ENCOUNTER — Ambulatory Visit: Payer: BC Managed Care – PPO | Admitting: Gastroenterology

## 2021-04-06 ENCOUNTER — Ambulatory Visit: Payer: Self-pay | Admitting: Neurology

## 2021-04-27 ENCOUNTER — Other Ambulatory Visit: Payer: Self-pay

## 2021-04-27 ENCOUNTER — Encounter: Payer: Self-pay | Admitting: Gastroenterology

## 2021-04-27 ENCOUNTER — Ambulatory Visit (INDEPENDENT_AMBULATORY_CARE_PROVIDER_SITE_OTHER): Payer: Self-pay | Admitting: Gastroenterology

## 2021-04-27 VITALS — BP 138/72 | HR 63 | Ht 65.0 in | Wt 166.1 lb

## 2021-04-27 DIAGNOSIS — K648 Other hemorrhoids: Secondary | ICD-10-CM

## 2021-04-27 DIAGNOSIS — E538 Deficiency of other specified B group vitamins: Secondary | ICD-10-CM

## 2021-04-27 DIAGNOSIS — K50919 Crohn's disease, unspecified, with unspecified complications: Secondary | ICD-10-CM

## 2021-04-27 NOTE — Patient Instructions (Addendum)
If you are age 55 or older, your body mass index should be between 23-30. Your Body mass index is 27.64 kg/m?Brooke Holmes If this is out of the aforementioned range listed, please consider follow up with your Primary Care Provider. ? ?If you are age 18 or younger, your body mass index should be between 19-25. Your Body mass index is 27.64 kg/m?Brooke Holmes If this is out of the aformentioned range listed, please consider follow up with your Primary Care Provider.  ? ?________________________________________________________ ? ?The [facility] providers would like to encourage you to use [facility] to communicate with providers for non-urgent requests or questions.  Due to long hold times on the telephone, sending your provider a message by [facility] may be a faster and more efficient way to get a response.  Please allow 48 business hours for a response.  Please remember that this is for non-urgent requests.  ?_______________________________________________________ ? ?Add Colace 1 tablet daily ? ?Continue current medications. ? ?Follow up in 1 year ? ?Thank you, ? ?Dr. Jackquline Denmark ? ? ? ? ? ?We want to thank you for trusting [facility] with your care. All of our staff and providers value the relationships we have built with our patients, and it is an honor to care for you.  ? ?We are writing to let you know that [facility] [place] will close on Jun 13, 2021, and we invite you to continue to see Dr. Carmell Austria and Gerrit Heck at the [facility] [place] office location. We are consolidating our serices at these [facility] practices to better provide care. Our office staff will work with you to ensure a seamless transition.  ? ?Gerrit Heck, DO -Dr. Bryan Lemma will be movig to [facility] at [address], [place], [place] [address], effective Jun 13, 2021.  Contact [phone] to schedule an appointment with him.  ? ?Carmell Austria, MD- Dr. Lyndel Safe will be movig to [facility] at [address] [place], [place] [address], effective Jun 13, 2021.  Contact [phone] to schedule an appointment with him.  ? ?Requesting Medical Records ?If you need to request your medical records, please follow the instructions below. Your medical records are confidential, and a copy can be transferred to another provider or released to you or another person you designate only with your permission. ? ?There are several ways to request your medical records: ?Requests for medical records can be submitted through our practice.   ?You can also request your records electronically, in your MyChart account by selecting the ?Request Health Records? tab.  ?If you need additional information on how to request records, please go to [email or web address]/, choose Patient Information, then select Request Medical Records. ?To make an appointment or if you have any questions about your health care needs, please contact our office at [phone] and one of our staff members will be glad to assist you. ?[facility] is committed to providing exceptional care for you and our community. Thank you for allowing [place] to serve your health care needs. ?Sincerely, ? ?Windy Canny, Director [facility] ?[facility] also offers convenient virtual care options. Sore throat? Sinus problems? Cold or flu symptoms? Get care from the comfort of home with [facility] Video Visits and e-Visits. Learn more about the non-emergency conditions treated and start your virtual visit at [email or web address] ? ?
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
## 2021-06-13 ENCOUNTER — Other Ambulatory Visit: Payer: Self-pay | Admitting: Gastroenterology

## 2021-11-16 IMAGING — CT CT ABD-PELV W/ CM
2 of 5 series · 15 of 46 positions shown, 17 images · IV contrast (omnipaque)
Comparison: None.

CLINICAL DATA: Abdominal distension. Abdominal bloating for 2
months.

EXAM:
CT ABDOMEN AND PELVIS WITH CONTRAST
TECHNIQUE: Multidetector CT imaging of the abdomen and pelvis was performed
using the standard protocol following bolus administration of
intravenous contrast.
CONTRAST:  100mL OMNIPAQUE IOHEXOL 300 MG/ML  SOLN

[Series 2: axial st · axial · 0.74mm/px · z∈[-322,+58]mm · 12 of 90 slices shown, 14 images]
[im 7/90  soft-tissue]
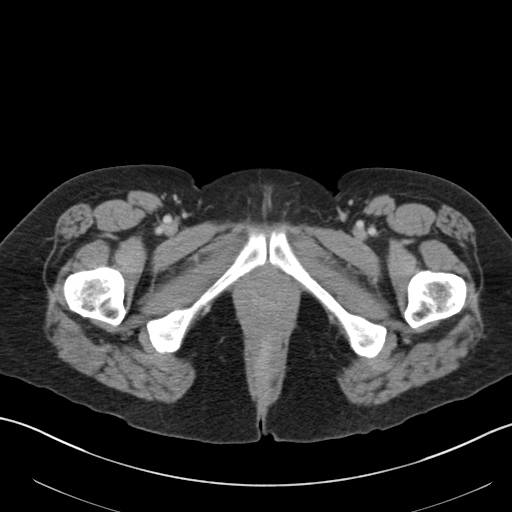
[im 7/90  bone]
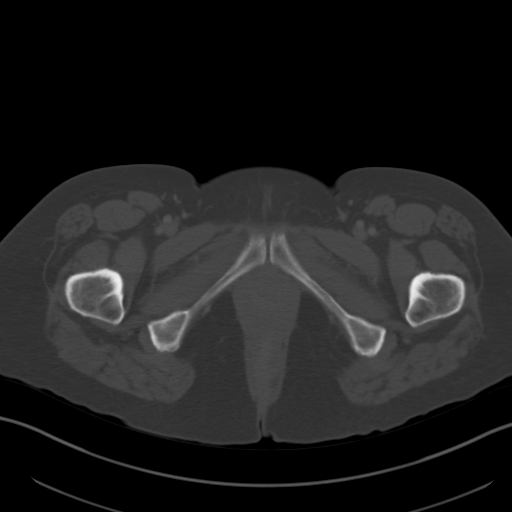
[im 13/90  soft-tissue]
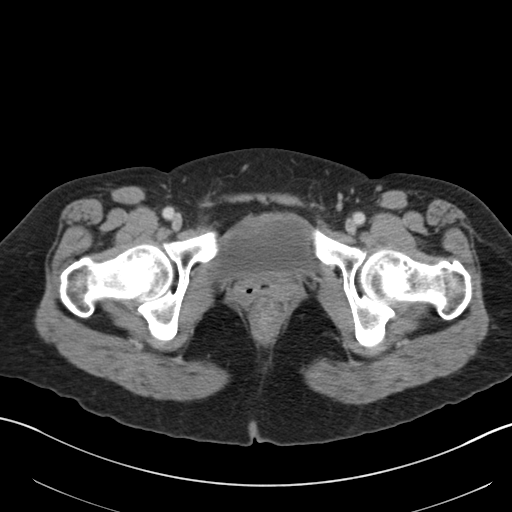
[im 20/90  soft-tissue]
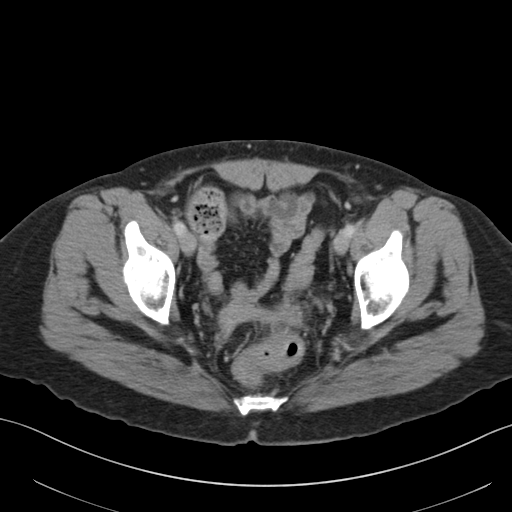
[im 26/90  soft-tissue]
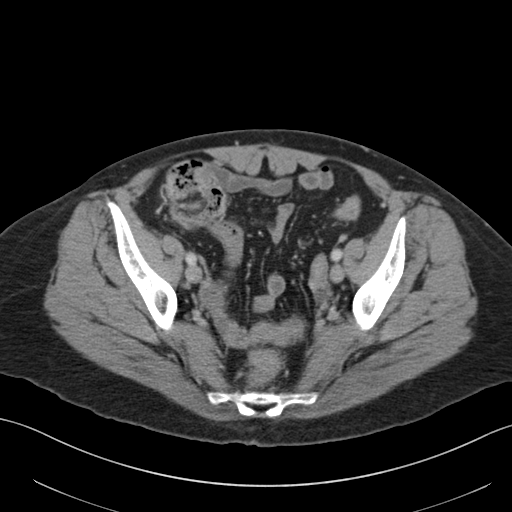
[im 32/90  soft-tissue]
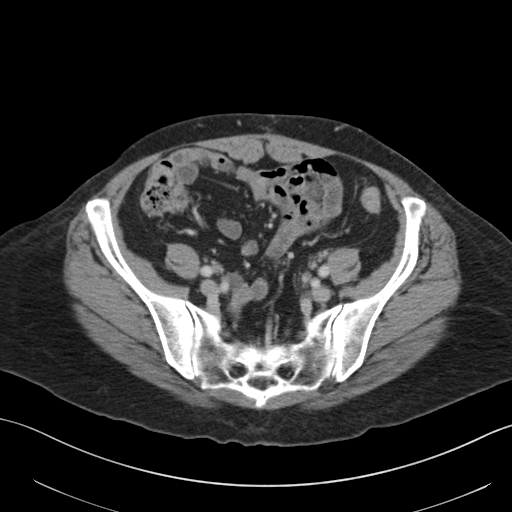
[im 39/90  soft-tissue]
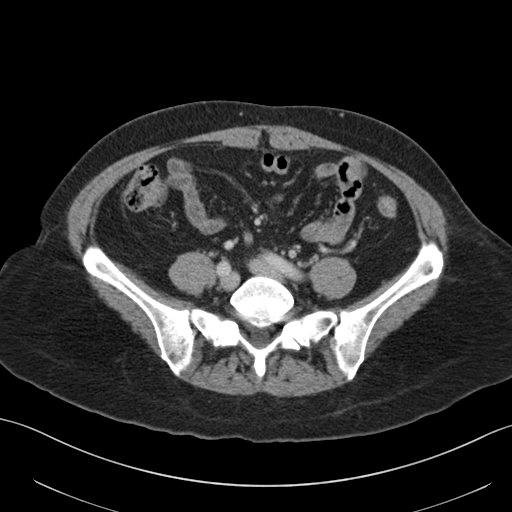
[im 51/90  soft-tissue]
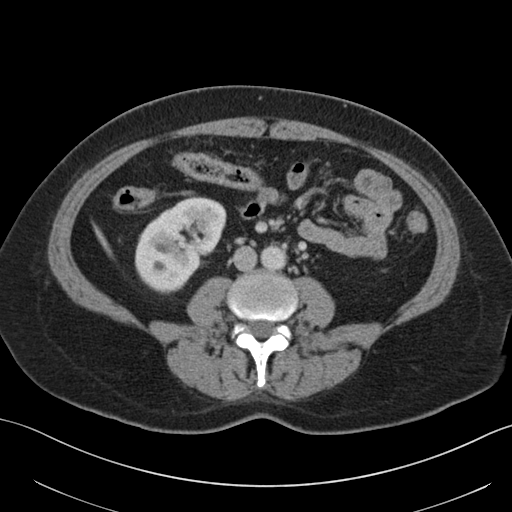
[im 58/90  soft-tissue]
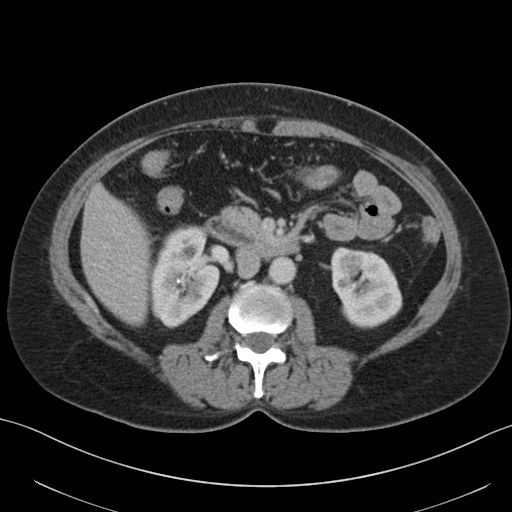
[im 64/90  soft-tissue]
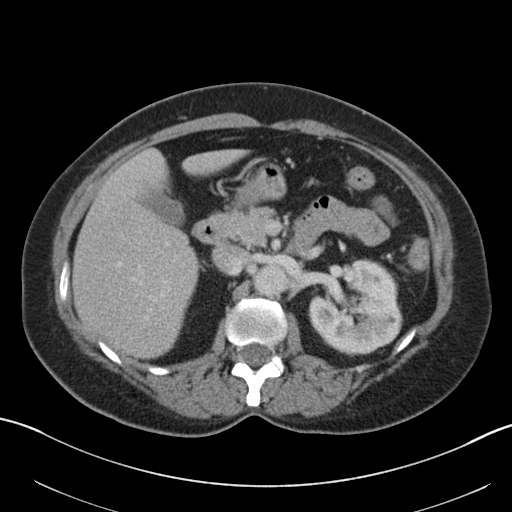
[im 64/90  bone]
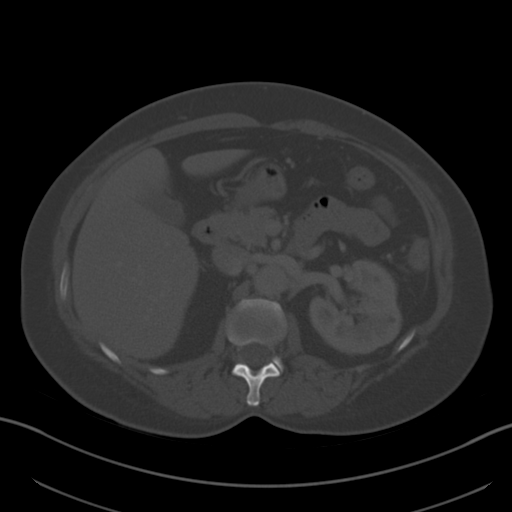
[im 70/90  soft-tissue]
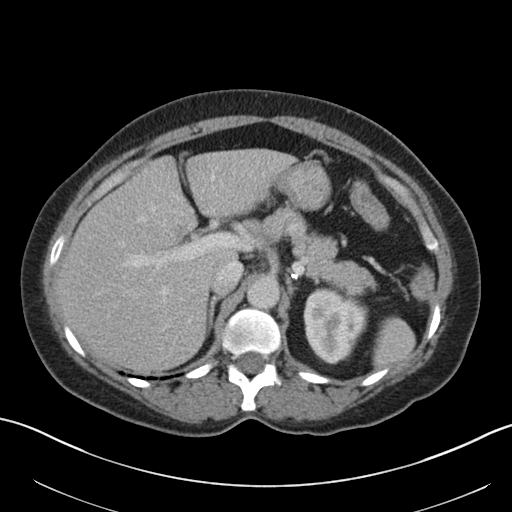
[im 77/90  soft-tissue]
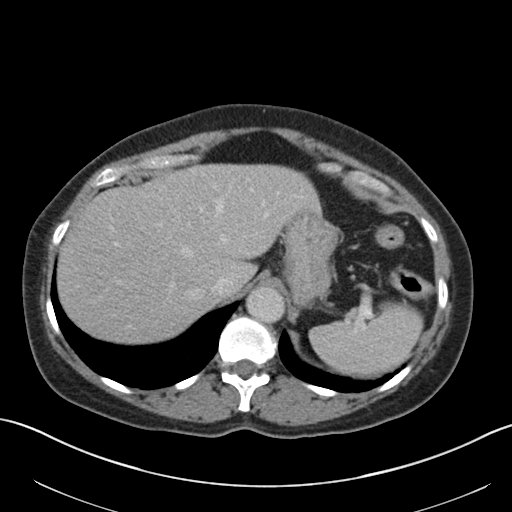
[im 83/90  soft-tissue]
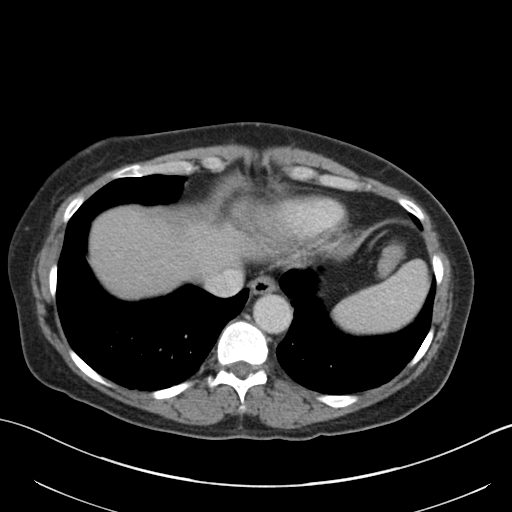

[Series 5: coronal st · coronal · 0.68mm/px · 3 of 134 slices shown]
[im 45/134  soft-tissue]
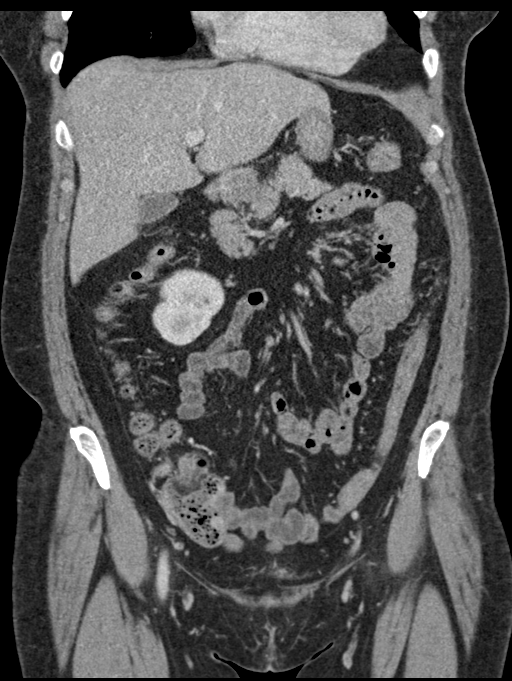
[im 60/134  soft-tissue]
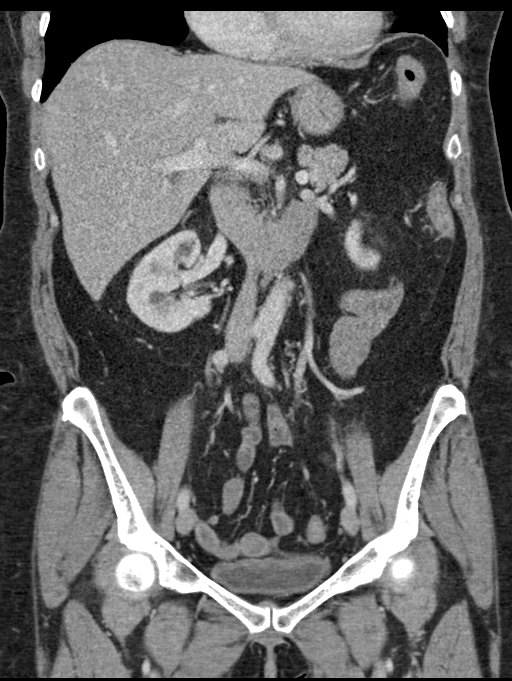
[im 74/134  soft-tissue]
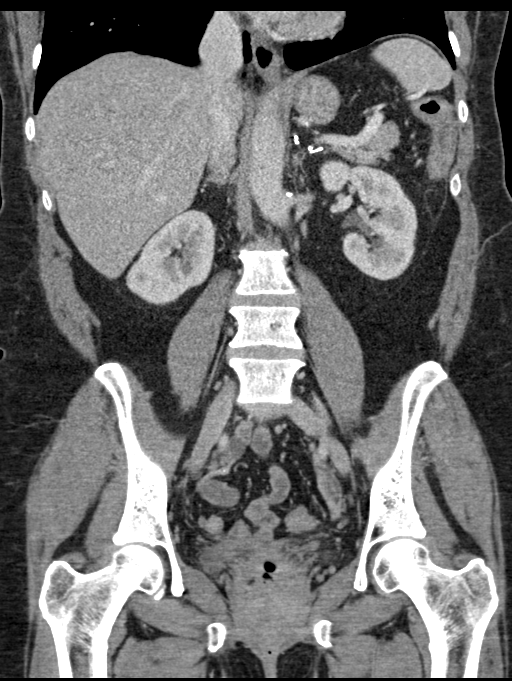

[15 of 46 positions shown; findings below may reference images not displayed]

FINDINGS: Lower chest: Hypoventilatory changes at the lung basis. Focal
airspace disease or pleural effusion. Heart is normal in size.

Hepatobiliary: No focal liver abnormality is seen. No gallstones,
gallbladder wall thickening, or biliary dilatation.

Pancreas: No ductal dilatation or inflammation.

Spleen: Lobulated contours.  Normal in size.  No focal abnormality.

Adrenals/Urinary Tract: Left adrenalectomy. Normal right adrenal
gland. No hydronephrosis. There is no perinephric edema. There
multiple small low-density lesions within both kidneys are too small
to accurately characterize but likely small cysts. Symmetric renal
excretion on delayed phase imaging. 3 mm nonobstructing stone in the
upper right kidney. Urinary bladder is minimally distended. No
bladder wall thickening.

Stomach/Bowel: Bowel evaluation is limited in the absence of enteric
contrast. The stomach is nondistended and unremarkable. There is no
small bowel obstruction or evidence of small bowel inflammation. No
terminal ileal inflammation. Minimal submucosal fatty infiltration
involving the terminal ileum. Normal appendix. Small volume of stool
in the ascending and proximal transverse colon. Distal transverse
colon through the descending are decompressed. Mild submucosal fatty
infiltration of the descending colon. There is mild rectal wall
thickening, series 2, image 73, with mild perirectal fat stranding.

Vascular/Lymphatic: Mild aortic atherosclerosis. No aortic aneurysm.
The portal vein is patent. No enlarged abdominopelvic lymph nodes.

Reproductive: Status post hysterectomy. No adnexal masses. Left
ovary is quiescent. Right ovary tentatively visualized and
quiescent.

Other: No ascites or free air. No intra-abdominal abscess.
Postsurgical change of the anterior abdominal wall. No abdominal
wall hernia.

Musculoskeletal: There are no acute or suspicious osseous
abnormalities.
IMPRESSION: 1. Mild rectal wall thickening with mild perirectal fat stranding,
suspicious for proctitis.
2. No other acute abnormality in the abdomen/pelvis.
3. Nonobstructing right renal stone.

Aortic Atherosclerosis (DDWQX-R67.7).

## 2022-01-20 ENCOUNTER — Encounter: Payer: Self-pay | Admitting: Gastroenterology

## 2022-03-15 ENCOUNTER — Encounter: Payer: Self-pay | Admitting: Gastroenterology

## 2022-04-17 ENCOUNTER — Other Ambulatory Visit: Payer: Self-pay

## 2022-04-17 ENCOUNTER — Telehealth: Payer: Self-pay | Admitting: Gastroenterology

## 2022-04-17 DIAGNOSIS — K5080 Crohn's disease of both small and large intestine without complications: Secondary | ICD-10-CM

## 2022-04-17 MED ORDER — STELARA 90 MG/ML ~~LOC~~ SOSY: PREFILLED_SYRINGE | SUBCUTANEOUS | 5 refills | Status: DC

## 2022-04-17 NOTE — Telephone Encounter (Signed)
Pt stated that she is over due on her Sterara.  Prescription was sent to pharmacy/ Pt made aware. Pt previously scheduled to see Dr. Lyndel Safe on 05/19/2022 at 1:30 PM. Pt reminded  of Appointment. Pt verbalized understanding with all questions answered.

## 2022-04-17 NOTE — Telephone Encounter (Signed)
Inbound call from patient is requesting a refill for Stelara, states she is overdue on her medication. Please advise.

## 2022-04-17 NOTE — Telephone Encounter (Signed)
Left message for pt to call back  °

## 2022-04-19 NOTE — Telephone Encounter (Signed)
Clinical notes 04-27-2021 faxed to [facility] special[facility] pharmacy [phone]

## 2022-04-21 MED ORDER — STELARA 90 MG/ML ~~LOC~~ SOSY: PREFILLED_SYRINGE | SUBCUTANEOUS | 5 refills | Status: AC

## 2022-04-21 NOTE — Telephone Encounter (Signed)
Script resent

## 2022-04-21 NOTE — Addendum Note (Signed)
Addended by: Villa Herb on: 04/21/2022 02:28 PM   Modules accepted: Orders

## 2022-04-21 NOTE — Telephone Encounter (Signed)
Pharmacy never received the prescription for Stelara. [facility]

## 2022-05-19 ENCOUNTER — Ambulatory Visit (INDEPENDENT_AMBULATORY_CARE_PROVIDER_SITE_OTHER): Payer: BC Managed Care – PPO | Admitting: Gastroenterology

## 2022-05-19 ENCOUNTER — Encounter: Payer: Self-pay | Admitting: Gastroenterology

## 2022-05-19 VITALS — BP 118/80 | HR 89 | Ht 65.0 in

## 2022-05-19 DIAGNOSIS — K648 Other hemorrhoids: Secondary | ICD-10-CM | POA: Diagnosis not present

## 2022-05-19 DIAGNOSIS — K50919 Crohn's disease, unspecified, with unspecified complications: Secondary | ICD-10-CM | POA: Diagnosis not present

## 2022-05-19 DIAGNOSIS — E538 Deficiency of other specified B group vitamins: Secondary | ICD-10-CM | POA: Diagnosis not present
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
## 2022-05-19 NOTE — Patient Instructions (Addendum)
_______________________________________________________  If your blood pressure at your visit was 140/90 or greater, please contact your primary care physician to follow up on this.  _______________________________________________________  If you are age 56 or older, your body mass index should be between 23-30. Your Body mass index is 27.64 kg/m. If this is out of the aforementioned range listed, please consider follow up with your Primary Care Provider.  If you are age 82 or younger, your body mass index should be between 19-25. Your Body mass index is 27.64 kg/m. If this is out of the aformentioned range listed, please consider follow up with your Primary Care Provider.   ________________________________________________________  The [facility] providers would like to encourage you to use [facility] to communicate with providers for non-urgent requests or questions.  Due to long hold times on the telephone, sending your provider a message by [facility] may be a faster and more efficient way to get a response.  Please allow 48 business hours for a response.  Please remember that this is for non-urgent requests.  _______________________________________________________  Continue current medications  Follow up in 1 year.  Thank you,  Dr. Lynann Bologna

## 2022-09-28 ENCOUNTER — Telehealth: Payer: Self-pay | Admitting: Gastroenterology

## 2022-09-28 NOTE — Telephone Encounter (Signed)
Inbound call from [employer] wanting to leave patient's nurse case manager contact information. Nurse case manager is Brianne at [phone] ext [phone]

## 2023-01-16 ENCOUNTER — Other Ambulatory Visit: Payer: Self-pay | Admitting: Orthopedic Surgery

## 2023-01-18 LAB — SURGICAL PATHOLOGY

## 2023-03-02 ENCOUNTER — Encounter: Payer: Self-pay | Admitting: Gastroenterology

## 2023-03-27 ENCOUNTER — Telehealth: Payer: Self-pay

## 2023-03-27 NOTE — Telephone Encounter (Signed)
 Received fax from [facility] for pt Stelara PA. Fax was sent To [employer] at [phone]

## 2023-04-04 NOTE — Telephone Encounter (Signed)
 [facility] called stating PA will be on hold a good call back number is the [phone]. They also stated the pharmacy number is [phone].

## 2023-04-19 ENCOUNTER — Telehealth: Payer: Self-pay

## 2023-04-19 NOTE — Telephone Encounter (Signed)
 Pharmacy Patient Advocate Encounter   Received notification from Physician's Office that prior authorization for [facility]) 90 MG/ML SOSY injection is required/requested.   Insurance verification completed.   The patient is insured [facility] .   Per test claim: PA required; PA submitted to above mentioned insurance via Fax Key/confirmation #/[id number] Status is pending

## 2023-04-25 ENCOUNTER — Telehealth: Payer: Self-pay

## 2023-04-25 NOTE — Telephone Encounter (Signed)
 [facility] stating the [facility] approval for the Stelara. Pt was left a detailed  voice message notifying her of the approval.  Faxed sent to be scanned into epic.

## 2023-05-04 NOTE — Telephone Encounter (Signed)
 Pharmacy Patient Advocate Encounter  Received notification from [facility] that Prior Authorization for [facility]) 90 MG/ML SOSY injection has been APPROVED from 04-20-2023 to 04-19-2024   PA #/Case ID/Reference #: [id number]

## 2023-05-21 ENCOUNTER — Telehealth: Payer: Self-pay | Admitting: Gastroenterology

## 2023-05-21 NOTE — Telephone Encounter (Signed)
 Pt husband Mara Seminole  stated that the pt is currently without insurance and is in need of some assistance from [place] and [place] in regard to her [place].  Mara Seminole was notified that I would look into this and give him a call back.

## 2023-05-21 NOTE — Telephone Encounter (Signed)
 PT's significant other is calling in regards to Stelara . He said that he had spoke with nurse regarding which insurances would give him a better cost on the medication. They are switching to [employer] bronze S and wants to know what he can find out to help him save. Please advise.

## 2023-05-22 NOTE — Telephone Encounter (Signed)
 Pt husband Brooke Holmes was notified that our office has filled out the necessary information that is needed for the [facility] [place] provider part and faxed to [phone]. Brooke Holmes was notified that they is a part for the pt to fill out and sent to them. Brooke Holmes  verbalized understanding with all questions answered.

## 2023-05-22 NOTE — Telephone Encounter (Signed)
 Inbound call from patient's husband, requesting follow up on message below.

## 2023-06-06 NOTE — Telephone Encounter (Signed)
 PT calling for an update on the stelara  assistance through insurance. Please advise.

## 2023-06-06 NOTE — Telephone Encounter (Addendum)
 Pt husband  stated that they are getting no where with the pt assistance through [place] and [place]. Mara Seminole stated that they have went ahead and purchased [employer]. Landon Pinion questioned about the assistance with a care path card through [employer]. Pt chart was reviewed and noted that it has been over a year since an office visit and pt will need a new prescription along with new documentation. This all was discussed with the pt husband Pt was rescheduled for an office visit on 06/21/2023 at 8:50 am with Dr Venice Gillis. Mara Seminole made aware.  Mara Seminole verbalized understanding with all questions answered.

## 2023-06-21 ENCOUNTER — Other Ambulatory Visit (INDEPENDENT_AMBULATORY_CARE_PROVIDER_SITE_OTHER)

## 2023-06-21 ENCOUNTER — Ambulatory Visit: Admitting: Gastroenterology

## 2023-06-21 ENCOUNTER — Encounter: Payer: Self-pay | Admitting: Gastroenterology

## 2023-06-21 VITALS — BP 122/80 | HR 80 | Ht 65.0 in | Wt 146.0 lb

## 2023-06-21 DIAGNOSIS — E538 Deficiency of other specified B group vitamins: Secondary | ICD-10-CM | POA: Diagnosis not present

## 2023-06-21 DIAGNOSIS — K648 Other hemorrhoids: Secondary | ICD-10-CM

## 2023-06-21 DIAGNOSIS — K50119 Crohn's disease of large intestine with unspecified complications: Secondary | ICD-10-CM | POA: Diagnosis not present

## 2023-06-21 LAB — COMPREHENSIVE METABOLIC PANEL WITH GFR
ALT: 52 U/L — ABNORMAL HIGH (ref 0–35)
AST: 38 U/L — ABNORMAL HIGH (ref 0–37)
Albumin: 4.5 g/dL (ref 3.5–5.2)
Alkaline Phosphatase: 47 U/L (ref 39–117)
BUN: 25 mg/dL — ABNORMAL HIGH (ref 6–23)
CO2: 28 meq/L (ref 19–32)
Calcium: 9.8 mg/dL (ref 8.4–10.5)
Chloride: 100 meq/L (ref 96–112)
Creatinine, Ser: 1 mg/dL (ref 0.40–1.20)
GFR: 62.73 mL/min (ref >60.00)
Glucose, Bld: 86 mg/dL (ref 70–99)
Potassium: 3.4 meq/L — ABNORMAL LOW (ref 3.5–5.1)
Sodium: 137 meq/L (ref 135–145)
Total Bilirubin: 1.4 mg/dL — ABNORMAL HIGH (ref 0.2–1.2)
Total Protein: 7.6 g/dL (ref 6.0–8.3)

## 2023-06-21 LAB — CBC WITH DIFFERENTIAL/PLATELET
Basophils Absolute: 0 10*3/uL (ref 0.0–0.1)
Basophils Relative: 1.1 % (ref 0.0–3.0)
Eosinophils Absolute: 0.1 10*3/uL (ref 0.0–0.7)
Eosinophils Relative: 3 % (ref 0.0–5.0)
HCT: 36.8 % (ref 36.0–46.0)
Hemoglobin: 12.8 g/dL (ref 12.0–15.0)
Lymphocytes Relative: 48.9 % — ABNORMAL HIGH (ref 12.0–46.0)
Lymphs Abs: 1.7 10*3/uL (ref 0.7–4.0)
MCHC: 34.9 g/dL (ref 30.0–36.0)
MCV: 93.8 fl (ref 78.0–100.0)
Monocytes Absolute: 0.4 10*3/uL (ref 0.1–1.0)
Monocytes Relative: 11.4 % (ref 3.0–12.0)
Neutro Abs: 1.3 10*3/uL — ABNORMAL LOW (ref 1.4–7.7)
Neutrophils Relative %: 35.6 % — ABNORMAL LOW (ref 43.0–77.0)
Platelets: 274 10*3/uL (ref 150.0–400.0)
RBC: 3.92 Mil/uL (ref 3.87–5.11)
RDW: 13.7 % (ref 11.5–15.5)
WBC: 3.6 10*3/uL — ABNORMAL LOW (ref 4.0–10.5)

## 2023-06-21 LAB — VITAMIN B12: Vitamin B-12: 1329 pg/mL — ABNORMAL HIGH (ref 211–911)

## 2023-06-21 LAB — C-REACTIVE PROTEIN: CRP: <1 mg/dL (ref 0.5–20.0)

## 2023-06-21 MED ORDER — AZATHIOPRINE 50 MG PO TABS: 50.0000 mg | ORAL_TABLET | Freq: Every day | ORAL | 3 refills | Status: AC
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
## 2023-06-21 NOTE — Patient Instructions (Addendum)
 _______________________________________________________  If your blood pressure at your visit was 140/90 or greater, please contact your primary care physician to follow up on this.  _______________________________________________________  If you are age 57 or older, your body mass index should be between 23-30. Your Body mass index is 24.3 kg/m. If this is out of the aforementioned range listed, please consider follow up with your Primary Care Provider.  If you are age 72 or younger, your body mass index should be between 19-25. Your Body mass index is 24.3 kg/m. If this is out of the aformentioned range listed, please consider follow up with your Primary Care Provider.   ________________________________________________________  The [facility] providers would like to encourage you to use MYCHART to communicate with providers for non-urgent requests or questions.  Due to long hold times on the telephone, sending your provider a message by [facility] may be a faster and more efficient way to get a response.  Please allow 48 business hours for a response.  Please remember that this is for non-urgent requests.  _______________________________________________________  Your provider has requested that you go to the basement level for lab work before leaving today. Press "B" on the elevator. The lab is located at the first door on the left as you exit the elevator.  Please call us  in 2 weeks if you haven't heard anything about your stelara  medication from the nurse  We have sent the following medications to your pharmacy for you to pick up at your convenience: Imuran  Please follow up in 12 months. Give us  a call at [phone] to schedule an appointment.  Thank you,  Dr. Lajuan Pila

## 2023-06-23 ENCOUNTER — Ambulatory Visit: Payer: Self-pay | Admitting: Gastroenterology

## 2023-06-27 ENCOUNTER — Telehealth: Payer: Self-pay | Admitting: Gastroenterology

## 2023-06-27 ENCOUNTER — Other Ambulatory Visit: Payer: Self-pay

## 2023-06-27 DIAGNOSIS — R7989 Other specified abnormal findings of blood chemistry: Secondary | ICD-10-CM

## 2023-06-27 DIAGNOSIS — E876 Hypokalemia: Secondary | ICD-10-CM

## 2023-06-27 MED ORDER — POTASSIUM CHLORIDE CRYS ER 20 MEQ PO TBCR: 20.0000 meq | EXTENDED_RELEASE_TABLET | Freq: Every day | ORAL | 0 refills | Status: DC

## 2023-06-27 NOTE — Telephone Encounter (Signed)
 Inbound call from patient, returning stevens call in regards to results.

## 2023-06-27 NOTE — Telephone Encounter (Signed)
 Spoke with pt. Documented in result  note.  Pt verbalized understanding with all questions answered.

## 2023-06-29 NOTE — Telephone Encounter (Signed)
-----   Message from Nurse Su Ellison sent at 06/22/2023 11:43 AM EDT ----- Regarding: RE: Stelera Samples Yes will check with PA team to see what is covered & if she can apply for the savings card. Please advise on dosing for Tremfya, thanks!  Initial Loading Dose: Infuse 200 mg IV over 1 hour at week 0, 4, and 8 Maintenance Dose: 100 mg SQ at week 16, then every 8 weeks or 200 mg SQ at week 12, then every 4 weeks. ----- Message ----- From: Lajuan Pila, MD Sent: 06/21/2023   4:45 PM EDT To: Brooke Oren Bing, CMA; Kaira A Chnupa, RN Subject: RE: Dorenda Gandy Samples                            It will be great to transition to Tremfya. I do believe she has INS now May have high deductible Can we get her $0 out of pocket card?  RG ----- Message ----- From: Lanis Pitcher, RN Sent: 06/21/2023   9:40 AM EDT To: Lajuan Pila, MD; Junella Olden, CMA Subject: Dorenda Gandy Samples                                Gurney Lefort reached out to the [facility] rep regarding samples for this patient & stated "the rep said they don't sample Stelara  anymore - their new goal is to transition people to Tremfya!". ----- Message ----- From: Junella Olden, CMA Sent: 06/21/2023   9:22 AM EDT To: Lanis Pitcher, RN  Can we see about stelara  samples? As soon as you can. Patient has no insurance

## 2023-06-29 NOTE — Telephone Encounter (Signed)
 Pt and pt husband Brooke Holmes made aware of Dr. Venice Gillis recommendations to proceed with the Tremfya. Discussed home infusions through [facility]. Pt and husband are in agreement with the Home [facility]usions [facility].  Dr. Venice Gillis please see note below and advise on Dosage for the Tremfya.

## 2023-07-04 ENCOUNTER — Ambulatory Visit: Admitting: Gastroenterology

## 2023-07-04 ENCOUNTER — Ambulatory Visit (HOSPITAL_COMMUNITY)
Admission: RE | Admit: 2023-07-04 | Discharge: 2023-07-04 | Disposition: A | Discharge status: Home / Self Care | Source: Ambulatory Visit | Attending: Gastroenterology | Admitting: Gastroenterology

## 2023-07-04 DIAGNOSIS — K8020 Calculus of gallbladder without cholecystitis without obstruction: Secondary | ICD-10-CM | POA: Diagnosis not present

## 2023-07-04 DIAGNOSIS — R7989 Other specified abnormal findings of blood chemistry: Secondary | ICD-10-CM | POA: Diagnosis not present

## 2023-07-04 DIAGNOSIS — K760 Fatty (change of) liver, not elsewhere classified: Secondary | ICD-10-CM | POA: Diagnosis not present

## 2023-07-08 ENCOUNTER — Ambulatory Visit: Payer: Self-pay | Admitting: Gastroenterology

## 2023-07-18 ENCOUNTER — Telehealth: Payer: Self-pay

## 2023-07-18 DIAGNOSIS — D709 Neutropenia, unspecified: Secondary | ICD-10-CM | POA: Diagnosis not present

## 2023-07-18 DIAGNOSIS — D509 Iron deficiency anemia, unspecified: Secondary | ICD-10-CM | POA: Diagnosis not present

## 2023-07-18 DIAGNOSIS — E782 Mixed hyperlipidemia: Secondary | ICD-10-CM | POA: Diagnosis not present

## 2023-07-18 DIAGNOSIS — N951 Menopausal and female climacteric states: Secondary | ICD-10-CM | POA: Diagnosis not present

## 2023-07-18 DIAGNOSIS — R41840 Attention and concentration deficit: Secondary | ICD-10-CM | POA: Diagnosis not present

## 2023-07-18 DIAGNOSIS — I10 Essential (primary) hypertension: Secondary | ICD-10-CM | POA: Diagnosis not present

## 2023-07-18 NOTE — Telephone Encounter (Signed)
 Received call from [facility] requesting recent TB results.  Recent TB results were from January 2023.   Faxed to [facility]. [facility] is requesting if we could get a updated TB test.  Please review and advise

## 2023-07-19 ENCOUNTER — Other Ambulatory Visit: Payer: Self-pay | Admitting: Gastroenterology

## 2023-07-19 DIAGNOSIS — E876 Hypokalemia: Secondary | ICD-10-CM

## 2023-07-19 NOTE — Telephone Encounter (Signed)
 Please check TB Gold and hepatitis B surface antigen as well RG

## 2023-07-20 ENCOUNTER — Other Ambulatory Visit: Payer: Self-pay

## 2023-07-20 DIAGNOSIS — K50119 Crohn's disease of large intestine with unspecified complications: Secondary | ICD-10-CM

## 2023-07-20 DIAGNOSIS — K5080 Crohn's disease of both small and large intestine without complications: Secondary | ICD-10-CM

## 2023-07-20 NOTE — Telephone Encounter (Signed)
 Pt made aware of Dr. Venice Gillis recommendations: Orders for labs placed in Epic. Pt made aware. Location to lab provided. Pt verbalized understanding with all questions answered.

## 2023-07-20 NOTE — Telephone Encounter (Signed)
 Left message for pt to call back

## 2023-07-23 ENCOUNTER — Other Ambulatory Visit

## 2023-07-23 DIAGNOSIS — K50119 Crohn's disease of large intestine with unspecified complications: Secondary | ICD-10-CM | POA: Diagnosis not present

## 2023-07-23 DIAGNOSIS — K5080 Crohn's disease of both small and large intestine without complications: Secondary | ICD-10-CM | POA: Diagnosis not present

## 2023-07-24 LAB — HEPATITIS B SURFACE ANTIGEN: Hepatitis B Surface Ag: NONREACTIVE

## 2023-07-25 LAB — QUANTIFERON-TB GOLD PLUS
Mitogen-NIL: 9.71 [IU]/mL
NIL: 0.03 [IU]/mL
QuantiFERON-TB Gold Plus: NEGATIVE
TB1-NIL: <0 [IU]/mL
TB2-NIL: 0 [IU]/mL

## 2023-08-01 ENCOUNTER — Ambulatory Visit: Payer: Self-pay | Admitting: Gastroenterology

## 2023-08-01 DIAGNOSIS — K5090 Crohn's disease, unspecified, without complications: Secondary | ICD-10-CM | POA: Diagnosis not present

## 2023-08-23 ENCOUNTER — Telehealth: Payer: Self-pay | Admitting: Gastroenterology

## 2023-08-23 NOTE — Telephone Encounter (Signed)
 Patient called and stated her husband got a new job and she has already updated her new insurance and would like to know if we can see if her trivium is covered. Patient is requesting a call back. Please advise.

## 2023-08-24 NOTE — Telephone Encounter (Signed)
 Pt questioned if the Tremfya would be covered under her husbands new insurance. Pt was notified to call [facility]  and run this by there pharmacy. Pt verbalized understanding with all questions answered.

## 2023-08-24 NOTE — Telephone Encounter (Signed)
 Left message for pt to call back

## 2023-08-29 DIAGNOSIS — K5090 Crohn's disease, unspecified, without complications: Secondary | ICD-10-CM | POA: Diagnosis not present

## 2023-09-14 ENCOUNTER — Telehealth: Payer: Self-pay

## 2023-09-14 NOTE — Telephone Encounter (Signed)
 Received SMN form from [place] with [facility] requesting Dr. Ira review when he returns to office. She's asked please have Dr. Charlanne review, make edits (if desired) and sign SMN. Please either email or fax back to the pharmacy @ [phone]. Form placed in Dr. Ira office.

## 2023-09-19 ENCOUNTER — Telehealth: Payer: Self-pay

## 2023-09-19 NOTE — Telephone Encounter (Signed)
 Personal reminder was received in Epic.  Chart reviewed and noted pt had CMP, CBC drawn on 07/18/2023. Results in epic. Please review and advise on repeat labs.

## 2023-09-19 NOTE — Telephone Encounter (Signed)
-----   Message from Nurse Elspeth RAMAN sent at 06/27/2023 12:23 PM EDT ----- Personal reminder sent 06/27/2023  Repeat CBC, CMP in 12 weeks. Dr Charlanne

## 2023-09-21 NOTE — Telephone Encounter (Signed)
 SMN form completed. Delon with [facility] made aware.

## 2023-10-22 ENCOUNTER — Telehealth: Payer: Self-pay | Admitting: Gastroenterology

## 2023-10-22 NOTE — Telephone Encounter (Signed)
 Inbound call from patients husband wanting to know if Tremfya  would be covered by his new insurance [employer]. Would like to speak to [place]. Requesting a call back  Please advise  Thank you

## 2023-10-24 NOTE — Telephone Encounter (Signed)
 Spoke with Delon with [facility]. Delon stated that the issue was due to the pt switching insurances after medication has been started. Delon stated that they are working with the pt insurance.  Delon to contact pt today

## 2023-10-24 NOTE — Telephone Encounter (Signed)
 Pt questioned if the Tremfya  would be covered under her husbands new insurance. Pt notified that I would reach out to [facility] with [facility] and check on the Status of this.

## 2023-10-31 NOTE — Telephone Encounter (Signed)
 Received call from [facility] at [facility] that they have received the PA for pt Tremfya  with her new insurance.,  Induction dosing  Pt first IV Tremfya   dose was 08/01/2023  2nd dose was 09/01/2023  Pt last dose was supposed to be on 10/02/2023 but the pt did not receive the dose due to her switching insurance.  Pt last dose was 8 weeks ago.  Please advise if you would like for pt to receive the induction doses again or to schedule her last induction dose asap.  Please review and advise

## 2023-11-01 NOTE — Telephone Encounter (Signed)
 Brooke Holmes with [facility] is aware of Dr. Ira recommendations.

## 2023-11-01 NOTE — Telephone Encounter (Signed)
 No need for rpt induction Lets just go ahead with next dose ASAP RG

## 2023-11-07 ENCOUNTER — Telehealth: Payer: Self-pay

## 2023-11-07 NOTE — Telephone Encounter (Signed)
 Delon from [facility]  stated that pt is scheduled for her Tremfya  infusion tomorrow.

## 2023-12-10 ENCOUNTER — Telehealth: Payer: Self-pay

## 2023-12-10 NOTE — Telephone Encounter (Signed)
 Pt was contacted for an symptom update after her recent Tremfya  Infusions. Pt stated that she is feeling much better and having no abdominal pain since starting the infusions. Pt stated that she does have constipation sometimes and takes something OTC to make her bowels move.  Last BM today.  Pt was notified that she will be switching to the Injections. Pt stated that she feels that she is prepared to proceed with that.

## 2023-12-17 ENCOUNTER — Other Ambulatory Visit: Payer: Self-pay | Admitting: Family Medicine

## 2023-12-17 ENCOUNTER — Telehealth: Payer: Self-pay | Admitting: Gastroenterology

## 2023-12-17 ENCOUNTER — Other Ambulatory Visit: Payer: Self-pay

## 2023-12-17 DIAGNOSIS — K50119 Crohn's disease of large intestine with unspecified complications: Secondary | ICD-10-CM

## 2023-12-17 DIAGNOSIS — K5080 Crohn's disease of both small and large intestine without complications: Secondary | ICD-10-CM

## 2023-12-17 MED ORDER — TREMFYA PEN 100 MG/ML ~~LOC~~ SOAJ: 100.0000 mg | SUBCUTANEOUS | 6 refills | Status: AC

## 2023-12-17 NOTE — Telephone Encounter (Signed)
 Copied from CRM #[id number]. Topic: Clinical - Medication Refill >> Dec 17, 2023  9:34 AM Suzen RAMAN wrote: Medication: TREMFYA   Has the patient contacted their pharmacy? Yes   This is the patient's preferred pharmacy:    [facility] - [place], [place] - [address] [place] [address] Phone: [phone] Fax: [phone]  Is this the correct pharmacy for this prescription? Yes If no, delete pharmacy and type the correct one.   Has the prescription been filled recently? No  Is the patient out of the medication? Yes  Has the patient been seen for an appointment in the last year OR does the patient have an upcoming appointment? Yes  Can we respond through MyChart? Yes  Agent: Please be advised that Rx refills may take up to 3 business days. We ask that you follow-up with your pharmacy.

## 2023-12-17 NOTE — Telephone Encounter (Signed)
 Inbound call from patients husband wanting to speak to nurse Elspeth in regards to Tremfya  medication Please advise  Thank you

## 2023-12-17 NOTE — Telephone Encounter (Signed)
 Error CRM sent. Wrong office

## 2023-12-18 NOTE — Telephone Encounter (Signed)
 Spoke with Brooke Holmes from [facility] in regard to previous message. Brooke Holmes stated that that she is in need of the paper prescription for the pt Tremfya  Pen to be printed and she will pick it up today and fax it to [facility].  Pt husband Brooke Holmes  was notified of this and stated that they received a letter stating that the insurance is requesting that the [facility]. Brooke Holmes was notified that Brooke Holmes was coming to the office today to pick up the prescription and I would discuss this with her and give him a call back this afternoon with updated information.  Brooke Holmes verbalized understanding with all questions answered.

## 2023-12-19 NOTE — Addendum Note (Signed)
 Addended by: GRETTA LAURAINE HERO on: 12/19/2023 09:49 AM   Modules accepted: Orders

## 2023-12-19 NOTE — Telephone Encounter (Signed)
 Spoke with pt husband Garnette . Garnette  was notified that the prescription was sent. Pt was notified that I did speak with Delon with [facility] who stated that [facility] and [facility] worked together.  Garnette  verbalized understanding with all questions answered.

## 2023-12-19 NOTE — Telephone Encounter (Signed)
Left message for pt husband to call back:  

## 2023-12-25 NOTE — Telephone Encounter (Signed)
 Spoke with pt husband. Pt husband stated that [facility] needs clarification on the script. Delon with [facility] made aware. Delon stated that she spoke with [facility] earlier today and clarified the prescription.  Pt husband made aware.  Pt husband verbalized understanding with all questions answered.

## 2023-12-25 NOTE — Telephone Encounter (Addendum)
 Inbound call from patient's husband stating [facility] is needing more information provided for medication before being able to fill medication. Sates patient is due to take medication this week. Requesting a call at [phone]. Please advise, thank you.

## 2024-01-21 ENCOUNTER — Other Ambulatory Visit: Payer: Self-pay | Admitting: Gastroenterology

## 2024-01-21 DIAGNOSIS — E876 Hypokalemia: Secondary | ICD-10-CM

## 2024-02-16 ENCOUNTER — Other Ambulatory Visit: Payer: Self-pay | Admitting: Gastroenterology

## 2024-02-16 DIAGNOSIS — E876 Hypokalemia: Secondary | ICD-10-CM

## 2024-03-19 ENCOUNTER — Inpatient Hospital Stay: Admitting: Hematology

## 2024-03-19 ENCOUNTER — Inpatient Hospital Stay
